# Patient Record
Sex: Female | Born: 1975
Health system: Southern US, Community
[De-identification: ages and names within clinical notes are randomized; demographics above are authoritative.]

## PROBLEM LIST (undated history)

## (undated) DIAGNOSIS — J3089 Other allergic rhinitis: Secondary | ICD-10-CM

## (undated) DIAGNOSIS — K219 Gastro-esophageal reflux disease without esophagitis: Secondary | ICD-10-CM

## (undated) DIAGNOSIS — T7840XA Allergy, unspecified, initial encounter: Secondary | ICD-10-CM

## (undated) HISTORY — DX: Other allergic rhinitis: J30.89

## (undated) HISTORY — DX: Allergy, unspecified, initial encounter: T78.40XA

## (undated) HISTORY — DX: Gastro-esophageal reflux disease without esophagitis: K21.9

---

## 2002-07-07 ENCOUNTER — Emergency Department (HOSPITAL_COMMUNITY): Admission: EM | Admit: 2002-07-07 | Discharge: 2002-07-07 | Payer: Self-pay | Admitting: Emergency Medicine

## 2002-08-02 ENCOUNTER — Emergency Department (HOSPITAL_COMMUNITY): Admission: EM | Admit: 2002-08-02 | Discharge: 2002-08-03 | Payer: Self-pay | Admitting: *Deleted

## 2003-07-14 ENCOUNTER — Other Ambulatory Visit: Admission: RE | Admit: 2003-07-14 | Discharge: 2003-07-14 | Payer: Self-pay | Admitting: Obstetrics and Gynecology

## 2005-02-04 ENCOUNTER — Other Ambulatory Visit: Admission: RE | Admit: 2005-02-04 | Discharge: 2005-02-04 | Payer: Self-pay | Admitting: Obstetrics and Gynecology

## 2008-01-22 ENCOUNTER — Emergency Department: Payer: Self-pay | Admitting: Emergency Medicine

## 2008-02-03 ENCOUNTER — Emergency Department: Payer: Self-pay | Admitting: Internal Medicine

## 2010-11-15 ENCOUNTER — Ambulatory Visit: Payer: Self-pay | Admitting: Family Medicine

## 2010-12-06 ENCOUNTER — Other Ambulatory Visit (HOSPITAL_COMMUNITY)
Admission: RE | Admit: 2010-12-06 | Discharge: 2010-12-06 | Disposition: A | Discharge status: Home / Self Care | Payer: 59 | Source: Ambulatory Visit | Attending: Family Medicine | Admitting: Family Medicine

## 2010-12-06 ENCOUNTER — Ambulatory Visit (INDEPENDENT_AMBULATORY_CARE_PROVIDER_SITE_OTHER): Payer: 59 | Admitting: Family Medicine

## 2010-12-06 ENCOUNTER — Encounter: Payer: Self-pay | Admitting: Family Medicine

## 2010-12-06 VITALS — BP 130/90 | HR 91 | Temp 98.2°F | Ht 64.0 in | Wt 194.5 lb

## 2010-12-06 DIAGNOSIS — Z01419 Encounter for gynecological examination (general) (routine) without abnormal findings: Secondary | ICD-10-CM | POA: Insufficient documentation

## 2010-12-06 DIAGNOSIS — Z1159 Encounter for screening for other viral diseases: Secondary | ICD-10-CM | POA: Insufficient documentation

## 2010-12-06 DIAGNOSIS — Z0000 Encounter for general adult medical examination without abnormal findings: Secondary | ICD-10-CM | POA: Insufficient documentation

## 2010-12-06 DIAGNOSIS — Z113 Encounter for screening for infections with a predominantly sexual mode of transmission: Secondary | ICD-10-CM | POA: Insufficient documentation

## 2010-12-06 DIAGNOSIS — Z0001 Encounter for general adult medical examination with abnormal findings: Secondary | ICD-10-CM | POA: Insufficient documentation

## 2010-12-06 DIAGNOSIS — Z136 Encounter for screening for cardiovascular disorders: Secondary | ICD-10-CM

## 2010-12-06 NOTE — Progress Notes (Signed)
Subjective:    Patient ID: Tricia Davenport, female    DOB: 04/17/1976, 35 y.o.   MRN: [id number]  HPI  35 yo here to establish care and for CPX. No complaints. G0, no h/o abnormal pap smears. Denies any dysuria or vaginal discharge.  Review of Systems See HPI Patient reports no  vision/ hearing changes,anorexia, weight change, fever ,adenopathy, persistant / recurrent hoarseness, swallowing issues, chest pain, edema,persistant / recurrent cough, hemoptysis, dyspnea(rest, exertional, paroxysmal nocturnal), gastrointestinal  bleeding (melena, rectal bleeding), abdominal pain, excessive heart burn, GU symptoms(dysuria, hematuria, pyuria, voiding/incontinence  Issues) syncope, focal weakness, severe memory loss, concerning skin lesions, depression, anxiety, abnormal bruising/bleeding, major joint swelling, breast masses or abnormal vaginal bleeding.       No past medical history on file.  No past surgical history on file.  History   Social History  . Marital Status: Single    Spouse Name: N/A    Number of Children: N/A  . Years of Education: N/A   Occupational History  . Not on file.   Social History Main Topics  . Smoking status: Never Smoker   . Smokeless tobacco: Not on file  . Alcohol Use: Yes  . Drug Use: Not on file  . Sexually Active: Not on file   Other Topics Concern  . Not on file   Social History Narrative   Works for [employer].Single, no children.Tricia Davenport's sister in law.    Family History  Problem Relation Age of Onset  . Diabetes Mother   . Hypertension Mother   . Hypertension Father     No Known Allergies  No current outpatient prescriptions on file prior to visit.      Objective:   Physical Exam  BP 130/90  Pulse 91  Temp(Src) 98.2 F (36.8 C) (Oral)  Ht 5\' 4"  (1.626 m)  Wt 194 lb 8 oz (88.225 kg)  BMI 33.39 kg/m2  LMP 10/23/2010  General:  Well-developed,well-nourished,in no acute distress; alert,appropriate and cooperative  throughout examination Head:  normocephalic and atraumatic.   Eyes:  vision grossly intact, pupils equal, pupils round, and pupils reactive to light.   Ears:  R ear normal and L ear normal.   Nose:  no external deformity.   Mouth:  good dentition.   Neck:  No deformities, masses, or tenderness noted. Breasts:  No mass, nodules, thickening, tenderness, bulging, retraction, inflamation, nipple discharge or skin changes noted.   Lungs:  Normal respiratory effort, chest expands symmetrically. Lungs are clear to auscultation, no crackles or wheezes. Heart:  Normal rate and regular rhythm. S1 and S2 normal without gallop, murmur, click, rub or other extra sounds. Abdomen:  Bowel sounds positive,abdomen soft and non-tender without masses, organomegaly or hernias noted. Rectal:  no external abnormalities.   Genitalia:  Pelvic Exam:        External: normal female genitalia without lesions or masses        Vagina: normal without lesions or masses        Cervix: normal without lesions or masses        Adnexa: normal bimanual exam without masses or fullness        Uterus: normal by palpation        Pap smear: performed Msk:  No deformity or scoliosis noted of thoracic or lumbar spine.   Extremities:  No clubbing, cyanosis, edema, or deformity noted with normal full range of motion of all joints.   Neurologic:  alert & oriented X3 and gait normal.  Skin:  Intact without suspicious lesions or rashes Cervical Nodes:  No lymphadenopathy noted Axillary Nodes:  No palpable lymphadenopathy Psych:  Cognition and judgment appear intact. Alert and cooperative with normal attention span and concentration. No apparent delusions, illusions, hallucinations       Assessment & Plan:   1. Routine general medical examination at a health care facility    Reviewed preventive care protocols, scheduled due services, and updated immunizations Discussed nutrition, exercise, diet, and healthy lifestyle. Orders  Placed This Encounter  Procedures  . Basic Metabolic Panel (BMET)  . Direct LDL

## 2010-12-06 NOTE — Patient Instructions (Signed)
It was so nice to meet you. We will be in touch with your labs. I will definitely look at your web site and be in touch soon!

## 2010-12-07 LAB — BASIC METABOLIC PANEL
Potassium: 3.7 mEq/L (ref 3.5–5.3)
Sodium: 136 mEq/L (ref 135–145)

## 2010-12-07 LAB — LDL CHOLESTEROL, DIRECT: Direct LDL: 96 mg/dL

## 2010-12-12 ENCOUNTER — Encounter: Payer: Self-pay | Admitting: *Deleted

## 2013-03-03 ENCOUNTER — Ambulatory Visit (INDEPENDENT_AMBULATORY_CARE_PROVIDER_SITE_OTHER): Payer: 59 | Admitting: Family Medicine

## 2013-03-03 ENCOUNTER — Encounter: Payer: 59 | Admitting: Family Medicine

## 2013-03-03 ENCOUNTER — Encounter: Payer: Self-pay | Admitting: Family Medicine

## 2013-03-03 ENCOUNTER — Other Ambulatory Visit (HOSPITAL_COMMUNITY)
Admission: RE | Admit: 2013-03-03 | Discharge: 2013-03-03 | Disposition: A | Discharge status: Home / Self Care | Payer: 59 | Source: Ambulatory Visit | Attending: Family Medicine | Admitting: Family Medicine

## 2013-03-03 ENCOUNTER — Encounter: Payer: Self-pay | Admitting: *Deleted

## 2013-03-03 VITALS — BP 108/80 | HR 71 | Temp 98.2°F | Ht 64.25 in | Wt 197.8 lb

## 2013-03-03 DIAGNOSIS — Z0000 Encounter for general adult medical examination without abnormal findings: Secondary | ICD-10-CM

## 2013-03-03 DIAGNOSIS — Z124 Encounter for screening for malignant neoplasm of cervix: Secondary | ICD-10-CM

## 2013-03-03 DIAGNOSIS — Z13220 Encounter for screening for lipoid disorders: Secondary | ICD-10-CM

## 2013-03-03 DIAGNOSIS — Z01419 Encounter for gynecological examination (general) (routine) without abnormal findings: Secondary | ICD-10-CM | POA: Insufficient documentation

## 2013-03-03 LAB — COMPREHENSIVE METABOLIC PANEL
ALT: 24 U/L (ref 0–35)
AST: 25 U/L (ref 0–37)
CO2: 26 mEq/L (ref 19–32)
Calcium: 9.2 mg/dL (ref 8.4–10.5)
Chloride: 102 mEq/L (ref 96–112)
GFR: 126.75 mL/min (ref >60.00)
Potassium: 3.8 mEq/L (ref 3.5–5.1)
Sodium: 136 mEq/L (ref 135–145)
Total Protein: 8 g/dL (ref 6.0–8.3)

## 2013-03-03 LAB — LIPID PANEL
Total CHOL/HDL Ratio: 4
VLDL: 14.2 mg/dL (ref 0.0–40.0)

## 2013-03-03 NOTE — Patient Instructions (Signed)
We will call with lab results. Continue working on healthy eating, exercise and weight loss.

## 2013-03-03 NOTE — Addendum Note (Signed)
Addended by: Damita Lack on: 03/03/2013 10:47 AM   Modules accepted: Orders

## 2013-03-03 NOTE — Progress Notes (Signed)
Subjective:    Patient ID: Tricia Davenport, female    DOB: Jan 17, 1976, 37 y.o.   MRN: [id number]  HPI  The patient is here for annual wellness exam and preventative care.   Pt of Dr. Elmer Sow  No past abn paps.  No concern for STDs.  Running 2 days a week. Diet: healthy, fruit and veggies, water, gets adequate calcium intake. History   Social History  . Marital Status: Single    Spouse Name: N/A    Number of Children: N/A  . Years of Education: N/A   Social History Main Topics  . Smoking status: Never Smoker   . Smokeless tobacco: Never Used  . Alcohol Use: 0.6 oz/week    1 Glasses of wine per week     Comment: social  . Drug Use: No  . Sexual Activity: Yes    Birth Control/ Protection: Condom   Other Topics Concern  . None   Social History Narrative   Works for [employer].   Single, no children.   [name]'s sister in law.        Review of Systems  Constitutional: Negative for fever, fatigue and unexpected weight change.  HENT: Negative for congestion, ear pain, sinus pressure, sneezing, sore throat and trouble swallowing.   Eyes: Negative for pain and itching.  Respiratory: Negative for cough, chest tightness, shortness of breath and wheezing.   Cardiovascular: Negative for chest pain, palpitations and leg swelling.  Gastrointestinal: Negative for nausea, abdominal pain, diarrhea, constipation and blood in stool.  Genitourinary: Negative for dysuria, hematuria, vaginal discharge, difficulty urinating and menstrual problem.  Skin: Negative for rash.  Neurological: Negative for syncope, weakness, light-headedness, numbness and headaches.  Psychiatric/Behavioral: Negative for confusion and dysphoric mood. The patient is not nervous/anxious.        Objective:   Physical Exam  Constitutional: Vital signs are normal. She appears well-developed and well-nourished. She is cooperative.  Non-toxic appearance. She does not appear ill. No distress.  HENT:   Head: Normocephalic.  Right Ear: Hearing, tympanic membrane, external ear and ear canal normal.  Left Ear: Hearing, tympanic membrane, external ear and ear canal normal.  Nose: Nose normal.  Eyes: Conjunctivae, EOM and lids are normal. Pupils are equal, round, and reactive to light. Lids are everted and swept, no foreign bodies found.  Neck: Trachea normal and normal range of motion. Neck supple. Carotid bruit is not present. No mass and no thyromegaly present.  Cardiovascular: Normal rate, regular rhythm, S1 normal, S2 normal, normal heart sounds and intact distal pulses.  Exam reveals no gallop.   No murmur heard. Pulmonary/Chest: Effort normal and breath sounds normal. No respiratory distress. She has no wheezes. She has no rhonchi. She has no rales.  Abdominal: Soft. Normal appearance and bowel sounds are normal. She exhibits no distension, no fluid wave, no abdominal bruit and no mass. There is no hepatosplenomegaly. There is no tenderness. There is no rebound, no guarding and no CVA tenderness. No hernia.  Genitourinary: Vagina normal and uterus normal. No breast swelling, tenderness, discharge or bleeding. Pelvic exam was performed with patient prone. There is no rash, tenderness or lesion on the right labia. There is no rash, tenderness or lesion on the left labia. Uterus is not enlarged and not tender. Cervix exhibits no motion tenderness, no discharge and no friability. Right adnexum displays no mass, no tenderness and no fullness. Left adnexum displays no mass, no tenderness and no fullness.  Lymphadenopathy:    She has no  cervical adenopathy.    She has no axillary adenopathy.  Neurological: She is alert. She has normal strength. No cranial nerve deficit or sensory deficit.  Skin: Skin is warm, dry and intact. No rash noted.  Psychiatric: Her speech is normal and behavior is normal. Judgment normal. Her mood appears not anxious. Cognition and memory are normal. She does not exhibit a  depressed mood.          Assessment & Plan:  The patient's preventative maintenance and recommended screening tests for an annual wellness exam were reviewed in full today. Brought up to date unless services declined.  Counselled on the importance of diet, exercise, and its role in overall health and mortality. The patient's FH and SH was reviewed, including their home life, tobacco status, and drug and alcohol status.   2012 nml pap.. On q2  year schedule, if nml then space every 3 year. No family hx of breast cancer. No desire for STD screen.

## 2013-03-07 ENCOUNTER — Encounter: Payer: Self-pay | Admitting: *Deleted

## 2013-03-28 ENCOUNTER — Telehealth: Payer: Self-pay | Admitting: Family Medicine

## 2013-03-28 DIAGNOSIS — Z0279 Encounter for issue of other medical certificate: Secondary | ICD-10-CM

## 2013-03-28 NOTE — Telephone Encounter (Signed)
In outbox

## 2013-03-28 NOTE — Telephone Encounter (Signed)
Wellness form completed and placed in Dr. Daphine Deutscher in box for signature.

## 2013-03-28 NOTE — Telephone Encounter (Signed)
Pt dropped off Provider Notification forms to be completed. I have placed these on your desk for Dr. Ermalene Searing. Dr. Dayton Martes is her PCP but it looks like Dr. Ermalene Searing completed pt's CPE for this year. Thank you.

## 2014-04-03 ENCOUNTER — Encounter: Payer: 59 | Admitting: Family Medicine

## 2014-04-12 ENCOUNTER — Encounter: Payer: Self-pay | Admitting: Family Medicine

## 2014-04-12 ENCOUNTER — Other Ambulatory Visit (HOSPITAL_COMMUNITY)
Admission: RE | Admit: 2014-04-12 | Discharge: 2014-04-12 | Disposition: A | Discharge status: Home / Self Care | Payer: 59 | Source: Ambulatory Visit | Attending: Family Medicine | Admitting: Family Medicine

## 2014-04-12 ENCOUNTER — Ambulatory Visit (INDEPENDENT_AMBULATORY_CARE_PROVIDER_SITE_OTHER): Payer: 59 | Admitting: Family Medicine

## 2014-04-12 VITALS — BP 122/84 | HR 84 | Temp 98.2°F | Ht 64.5 in | Wt 205.5 lb

## 2014-04-12 DIAGNOSIS — Z01419 Encounter for gynecological examination (general) (routine) without abnormal findings: Secondary | ICD-10-CM

## 2014-04-12 DIAGNOSIS — Z1151 Encounter for screening for human papillomavirus (HPV): Secondary | ICD-10-CM | POA: Diagnosis present

## 2014-04-12 LAB — COMPREHENSIVE METABOLIC PANEL
ALT: 22 U/L (ref 0–35)
AST: 23 U/L (ref 0–37)
Albumin: 4.1 g/dL (ref 3.5–5.2)
Alkaline Phosphatase: 63 U/L (ref 39–117)
BILIRUBIN TOTAL: 0.3 mg/dL (ref 0.2–1.2)
BUN: 11 mg/dL (ref 6–23)
CHLORIDE: 104 meq/L (ref 96–112)
CO2: 23 mEq/L (ref 19–32)
CREATININE: 0.7 mg/dL (ref 0.4–1.2)
Calcium: 9 mg/dL (ref 8.4–10.5)
GFR: 132.68 mL/min (ref >60.00)
Glucose, Bld: 102 mg/dL — ABNORMAL HIGH (ref 70–99)
Potassium: 3.8 mEq/L (ref 3.5–5.1)
SODIUM: 136 meq/L (ref 135–145)
Total Protein: 7.7 g/dL (ref 6.0–8.3)

## 2014-04-12 LAB — CBC WITH DIFFERENTIAL/PLATELET
BASOS ABS: 0 10*3/uL (ref 0.0–0.1)
Basophils Relative: 0.4 % (ref 0.0–3.0)
EOS ABS: 0.3 10*3/uL (ref 0.0–0.7)
Eosinophils Relative: 3.7 % (ref 0.0–5.0)
HCT: 41 % (ref 36.0–46.0)
HEMOGLOBIN: 13.3 g/dL (ref 12.0–15.0)
LYMPHS ABS: 3 10*3/uL (ref 0.7–4.0)
LYMPHS PCT: 37.6 % (ref 12.0–46.0)
MCHC: 32.4 g/dL (ref 30.0–36.0)
MCV: 93.6 fl (ref 78.0–100.0)
Monocytes Absolute: 0.7 10*3/uL (ref 0.1–1.0)
Monocytes Relative: 9.1 % (ref 3.0–12.0)
NEUTROS ABS: 4 10*3/uL (ref 1.4–7.7)
Neutrophils Relative %: 49.2 % (ref 43.0–77.0)
PLATELETS: 347 10*3/uL (ref 150.0–400.0)
RBC: 4.38 Mil/uL (ref 3.87–5.11)
RDW: 13.1 % (ref 11.5–15.5)
WBC: 8 10*3/uL (ref 4.0–10.5)

## 2014-04-12 LAB — LIPID PANEL
CHOL/HDL RATIO: 6
Cholesterol: 157 mg/dL (ref 0–200)
HDL: 26.8 mg/dL — AB (ref >39.00)
LDL Cholesterol: 113 mg/dL — ABNORMAL HIGH (ref 0–99)
NONHDL: 130.2
Triglycerides: 86 mg/dL (ref 0.0–149.0)
VLDL: 17.2 mg/dL (ref 0.0–40.0)

## 2014-04-12 LAB — TSH: TSH: 2.13 u[IU]/mL (ref 0.35–4.50)

## 2014-04-12 NOTE — Progress Notes (Signed)
Pre visit review using our clinic review tool, if applicable. No additional management support is needed unless otherwise documented below in the visit note. 

## 2014-04-12 NOTE — Addendum Note (Signed)
Addended by: Desmond DikeKNIGHT, Vanity Larsson H on: 04/12/2014 08:50 AM   Modules accepted: Orders

## 2014-04-12 NOTE — Progress Notes (Signed)
Subjective:    Patient ID: Tricia Davenport, female    DOB: [birth year], 38 y.o.   MRN: [id number]  HPI  [age] yo pleasant female here for CPX. No complaints. G0, no h/o abnormal pap smears. Denies any dysuria or vaginal discharge.  Last pap smear done by Dr. Ermalene SearingBedsole on 03/07/13.  No current outpatient prescriptions on file prior to visit.   No current facility-administered medications on file prior to visit.    No Known Allergies  No past medical history on file.  No past surgical history on file.  Family History  Problem Relation Age of Onset  . Diabetes Mother   . Hypertension Mother   . Hypertension Father     History   Social History  . Marital Status: Single    Spouse Name: N/A    Number of Children: N/A  . Years of Education: N/A   Occupational History  . Not on file.   Social History Main Topics  . Smoking status: Never Smoker   . Smokeless tobacco: Never Used  . Alcohol Use: 0.6 oz/week    1 Glasses of wine per week     Comment: social  . Drug Use: No  . Sexual Activity: Yes    Birth Control/ Protection: Condom   Other Topics Concern  . Not on file   Social History Narrative   Works for [employer].   Single, no children.   [name]'s sister in law.   The PMH, PSH, Social History, Family History, Medications, and allergies [facility], and have been updated if relevant.   Review of Systems See HPI Patient reports no  vision/ hearing changes,anorexia, weight change, fever ,adenopathy, persistant / recurrent hoarseness, swallowing issues, chest pain, edema,persistant / recurrent cough, hemoptysis, dyspnea(rest, exertional, paroxysmal nocturnal), gastrointestinal  bleeding (melena, rectal bleeding), abdominal pain, excessive heart burn, GU symptoms(dysuria, hematuria, pyuria, voiding/incontinence  Issues) syncope, focal weakness, severe memory loss, concerning skin lesions, depression, anxiety, abnormal bruising/bleeding, major  joint swelling, breast masses or abnormal vaginal bleeding.       No past medical history on file.  No past surgical history on file.  History   Social History  . Marital Status: Single    Spouse Name: N/A    Number of Children: N/A  . Years of Education: N/A   Occupational History  . Not on file.   Social History Main Topics  . Smoking status: Never Smoker   . Smokeless tobacco: Never Used  . Alcohol Use: 0.6 oz/week    1 Glasses of wine per week     Comment: social  . Drug Use: No  . Sexual Activity: Yes    Birth Control/ Protection: Condom   Other Topics Concern  . Not on file   Social History Narrative   Works for [employer].   Single, no children.   [name]'s sister in law.    Family History  Problem Relation Age of Onset  . Diabetes Mother   . Hypertension Mother   . Hypertension Father     No Known Allergies  No current outpatient prescriptions on file prior to visit.   No current facility-administered medications on file prior to visit.      Objective:   Physical Exam  BP 122/84 mmHg  Pulse 84  Temp(Src) 98.2 F (36.8 C) (Oral)  Ht 5' 4.5" (1.638 m)  Wt 205 lb 8 oz (93.214 kg)  BMI 34.74 kg/m2  SpO2 98%  LMP 03/19/2014  General:  Well-developed,well-nourished,in no acute distress; alert,appropriate and cooperative throughout examination Head:  normocephalic and atraumatic.   Eyes:  vision grossly intact, pupils equal, pupils round, and pupils reactive to light.   Ears:  R ear normal and L ear normal.   Nose:  no external deformity.   Mouth:  good dentition.   Neck:  No deformities, masses, or tenderness noted. Breasts:  No mass, nodules, thickening, tenderness, bulging, retraction, inflamation, nipple discharge or skin changes noted.   Lungs:  Normal respiratory effort, chest expands symmetrically. Lungs are clear to auscultation, no crackles or wheezes. Heart:  Normal rate and regular rhythm. S1 and S2 normal without  gallop, murmur, click, rub or other extra sounds. Abdomen:  Bowel sounds positive,abdomen soft and non-tender without masses, organomegaly or hernias noted. Rectal:  no external abnormalities.   Genitalia:  Pelvic Exam:        External: normal female genitalia without lesions or masses        Vagina: normal without lesions or masses        Cervix: normal without lesions or masses        Adnexa: normal bimanual exam without masses or fullness        Uterus: normal by palpation        Pap smear: performed Msk:  No deformity or scoliosis noted of thoracic or lumbar spine.   Extremities:  No clubbing, cyanosis, edema, or deformity noted with normal full range of motion of all joints.   Neurologic:  alert & oriented X3 and gait normal.   Skin:  Intact without suspicious lesions or rashes Cervical Nodes:  No lymphadenopathy noted Axillary Nodes:  No palpable lymphadenopathy Psych:  Cognition and judgment appear intact. Alert and cooperative with normal attention span and concentration. No apparent delusions, illusions, hallucinations       Assessment & Plan:

## 2014-04-12 NOTE — Patient Instructions (Signed)
Great to see you. Happy Holidays!  We will call you with your lab results and you can view them online. 

## 2014-04-12 NOTE — Assessment & Plan Note (Signed)
.  Reviewed preventive care protocols, scheduled due services, and updated immunizations Discussed nutrition, exercise, diet, and healthy lifestyle.  Discussed USPSTF recommendations of cervical cancer screening.  She is aware that interval of 3 years is recommended but pt would prefer to have pap smear done today.  Orders Placed This Encounter  Procedures  . CBC with Differential  . Comprehensive metabolic panel  . Lipid panel  . TSH

## 2014-04-13 ENCOUNTER — Encounter: Payer: Self-pay | Admitting: *Deleted

## 2014-04-13 LAB — CYTOLOGY - PAP

## 2014-04-14 ENCOUNTER — Encounter: Payer: Self-pay | Admitting: *Deleted

## 2014-05-09 ENCOUNTER — Encounter: Payer: Self-pay | Admitting: Family Medicine

## 2014-05-09 ENCOUNTER — Ambulatory Visit (INDEPENDENT_AMBULATORY_CARE_PROVIDER_SITE_OTHER): Payer: 59 | Admitting: Family Medicine

## 2014-05-09 VITALS — BP 128/70 | HR 85 | Temp 98.0°F | Wt 207.8 lb

## 2014-05-09 DIAGNOSIS — J040 Acute laryngitis: Secondary | ICD-10-CM

## 2014-05-09 DIAGNOSIS — J3489 Other specified disorders of nose and nasal sinuses: Secondary | ICD-10-CM

## 2014-05-09 MED ORDER — IPRATROPIUM BROMIDE 0.06 % NA SOLN: 2.0000 | Freq: Four times a day (QID) | NASAL | Status: DC

## 2014-05-09 NOTE — Progress Notes (Signed)
   Dr. Karleen HampshireSpencer T. Cheney Ewart, MD, CAQ Sports Medicine [facility] [address] [place], [id number] Phone: [phone] Fax: [phone]  05/09/2014  Patient: Tricia MeuseMarcia Cobb, MRN: [id number], DOB: 1976/01/29, 38 y.o.  Primary Physician:  Ruthe Mannanalia Aron, MD  Chief Complaint: Nasal Congestion  Subjective:   Tricia MeuseMarcia Cobb is a 38 y.o. very pleasant female patient who presents with the following:  A lot of drainage and sounded abnormal. Over the past week the patient has had a lot of nasal drainage and her voice is been altered fairly significantly. She is not having any significant cough, no significant sore throat, no earache. She is eating and drinking normally.  Past Medical History, Surgical History, Social History, Family History, Problem List, Medications, and Allergies have been reviewed and updated if relevant.  ROS: GEN: Acute illness details above GI: Tolerating PO intake GU: maintaining adequate hydration and urination Pulm: No SOB Interactive and getting along well at home.  Otherwise, ROS is as per the HPI.   Objective:   BP 128/70 mmHg  Pulse 85  Temp(Src) 98 F (36.7 C) (Oral)  Wt 207 lb 12 oz (94.235 kg)  SpO2 96%  LMP 03/29/2014   Gen: WDWN, NAD; A & O x3, cooperative. Pleasant.Globally Non-toxic HEENT: Normocephalic and atraumatic. Throat clear, w/o exudate, R TM clear, L TM - good landmarks, No fluid present. rhinnorhea.  MMM Frontal sinuses: NT Max sinuses: NT NECK: Anterior cervical  LAD is absent CV: RRR, No M/G/R, cap refill <2 sec PULM: Breathing comfortably in no respiratory distress. no wheezing, crackles, rhonchi EXT: No c/c/e PSYCH: Friendly, good eye contact MSK: Nml gait     Laboratory and Imaging Data:  Assessment and Plan:   Laryngitis  Nasal drainage  Reassurance, supportive care, and suggested that she takes a Mucinex D.  Follow-up: No Follow-up on file.  New Prescriptions   IPRATROPIUM (ATROVENT) 0.06 %  NASAL SPRAY    Place 2 sprays into both nostrils 4 (four) times daily.   No orders of the defined types were placed in this encounter.    Signed,  Elpidio GaleaSpencer T. Rashun Grattan, MD   Patient's Medications  New Prescriptions   IPRATROPIUM (ATROVENT) 0.06 % NASAL SPRAY    Place 2 sprays into both nostrils 4 (four) times daily.  Previous Medications   No medications on file  Modified Medications   No medications on file  Discontinued Medications   No medications on file     ,[

## 2014-05-09 NOTE — Progress Notes (Signed)
Pre visit review using our clinic review tool, if applicable. No additional management support is needed unless otherwise documented below in the visit note. 

## 2014-12-22 ENCOUNTER — Encounter: Payer: Self-pay | Admitting: Primary Care

## 2014-12-22 ENCOUNTER — Ambulatory Visit (INDEPENDENT_AMBULATORY_CARE_PROVIDER_SITE_OTHER): Payer: 59 | Admitting: Primary Care

## 2014-12-22 VITALS — BP 124/70 | HR 80 | Temp 97.9°F | Ht 64.5 in | Wt 203.1 lb

## 2014-12-22 DIAGNOSIS — J029 Acute pharyngitis, unspecified: Secondary | ICD-10-CM | POA: Diagnosis not present

## 2014-12-22 NOTE — Progress Notes (Signed)
   Subjective:    Patient ID: Tricia Davenport, female    DOB: 1975-06-20, 39 y.o.   MRN: [id number]  HPI  Tricia Davenport is a 39 year old female who presents today with a chief complaint of sore throat. Her sore throat has been present for the past 2-3 days. She also reports nasal congestion and sinus pressure/headache. Overall her symptoms have improved, Wednesday she felt worse with body aches. Denies fevers, reports chills.    Review of Systems  Constitutional: Positive for chills. Negative for fever.  HENT: Positive for congestion, sinus pressure and sore throat. Negative for ear pain.   Respiratory: Negative for cough and shortness of breath.   Cardiovascular: Negative for chest pain.  Musculoskeletal: Positive for myalgias.       No past medical history on file.  Social History   Social History  . Marital Status: Single    Spouse Name: N/A  . Number of Children: N/A  . Years of Education: N/A   Occupational History  . Not on file.   Social History Main Topics  . Smoking status: Never Smoker   . Smokeless tobacco: Never Used  . Alcohol Use: 0.6 oz/week    1 Glasses of wine per week     Comment: social  . Drug Use: No  . Sexual Activity: Yes    Birth Control/ Protection: Condom   Other Topics Concern  . Not on file   Social History Narrative   Works for [employer].   Single, no children.   Tricia Davenport's sister in law.    No past surgical history on file.  Family History  Problem Relation Age of Onset  . Diabetes Mother   . Hypertension Mother   . Hypertension Father     No Known Allergies  No current outpatient prescriptions on file prior to visit.   No current facility-administered medications on file prior to visit.    BP 124/70 mmHg  Pulse 80  Temp(Src) 97.9 F (36.6 C) (Oral)  Ht 5' 4.5" (1.638 m)  Wt 203 lb 1.9 oz (92.135 kg)  BMI 34.34 kg/m2  SpO2 98%  LMP 12/11/2014    Objective:   Physical Exam  Constitutional: She appears  well-nourished.  HENT:  Right Ear: Tympanic membrane and ear canal normal.  Left Ear: Tympanic membrane and ear canal normal.  Nose: Right sinus exhibits no maxillary sinus tenderness and no frontal sinus tenderness. Left sinus exhibits no maxillary sinus tenderness and no frontal sinus tenderness.  Mouth/Throat: Posterior oropharyngeal erythema present. No oropharyngeal exudate or posterior oropharyngeal edema.  Cardiovascular: Normal rate and regular rhythm.   Pulmonary/Chest: Effort normal and breath sounds normal.  Skin: Skin is warm and dry.          Assessment & Plan:  Sore throat:  Present since Wednesday with nasal congestion and sinus pressure. Improvement overall with OTC meds. She would like rapid strep test today. Rapid strep: Negative. Push fluids, ibuprofen PRN, warm salt gargles. Follow up if no improvement by next Wednesday.

## 2014-12-22 NOTE — Patient Instructions (Signed)
Your strep test is negative.  Take ibuprofen 600 to 800 mg three times daily as needed for pain and inflammation to throat.  Gargle warm salt water three times daily for pain.  Call me if no improvement by next Wednesday.  It was a pleasure meeting you!

## 2014-12-22 NOTE — Progress Notes (Signed)
Pre visit review using our clinic review tool, if applicable. No additional management support is needed unless otherwise documented below in the visit note. 

## 2015-01-11 ENCOUNTER — Ambulatory Visit (INDEPENDENT_AMBULATORY_CARE_PROVIDER_SITE_OTHER): Payer: 59 | Admitting: Family Medicine

## 2015-01-11 ENCOUNTER — Other Ambulatory Visit (HOSPITAL_COMMUNITY)
Admission: RE | Admit: 2015-01-11 | Discharge: 2015-01-11 | Disposition: A | Discharge status: Home / Self Care | Payer: 59 | Source: Ambulatory Visit | Attending: Family Medicine | Admitting: Family Medicine

## 2015-01-11 ENCOUNTER — Encounter: Payer: Self-pay | Admitting: Family Medicine

## 2015-01-11 VITALS — BP 132/80 | HR 70 | Temp 97.9°F | Ht 64.0 in | Wt 204.5 lb

## 2015-01-11 DIAGNOSIS — Z1151 Encounter for screening for human papillomavirus (HPV): Secondary | ICD-10-CM | POA: Insufficient documentation

## 2015-01-11 DIAGNOSIS — Z01419 Encounter for gynecological examination (general) (routine) without abnormal findings: Secondary | ICD-10-CM | POA: Insufficient documentation

## 2015-01-11 LAB — LIPID PANEL
CHOL/HDL RATIO: 5
Cholesterol: 159 mg/dL (ref 0–200)
HDL: 33.8 mg/dL — ABNORMAL LOW (ref >39.00)
LDL CALC: 114 mg/dL — AB (ref 0–99)
NonHDL: 125.49
TRIGLYCERIDES: 58 mg/dL (ref 0.0–149.0)
VLDL: 11.6 mg/dL (ref 0.0–40.0)

## 2015-01-11 LAB — CBC WITH DIFFERENTIAL/PLATELET
Basophils Absolute: 0 10*3/uL (ref 0.0–0.1)
Basophils Relative: 0.4 % (ref 0.0–3.0)
EOS ABS: 0.2 10*3/uL (ref 0.0–0.7)
Eosinophils Relative: 4.4 % (ref 0.0–5.0)
HCT: 38.7 % (ref 36.0–46.0)
Hemoglobin: 12.9 g/dL (ref 12.0–15.0)
Lymphocytes Relative: 42.9 % (ref 12.0–46.0)
Lymphs Abs: 2.4 10*3/uL (ref 0.7–4.0)
MCHC: 33.3 g/dL (ref 30.0–36.0)
MCV: 92.3 fl (ref 78.0–100.0)
MONO ABS: 0.7 10*3/uL (ref 0.1–1.0)
Monocytes Relative: 11.6 % (ref 3.0–12.0)
Neutro Abs: 2.3 10*3/uL (ref 1.4–7.7)
Neutrophils Relative %: 40.7 % — ABNORMAL LOW (ref 43.0–77.0)
PLATELETS: 353 10*3/uL (ref 150.0–400.0)
RBC: 4.19 Mil/uL (ref 3.87–5.11)
RDW: 14 % (ref 11.5–15.5)
WBC: 5.6 10*3/uL (ref 4.0–10.5)

## 2015-01-11 LAB — COMPREHENSIVE METABOLIC PANEL
ALT: 43 U/L — ABNORMAL HIGH (ref 0–35)
AST: 39 U/L — AB (ref 0–37)
Albumin: 4.4 g/dL (ref 3.5–5.2)
Alkaline Phosphatase: 66 U/L (ref 39–117)
BUN: 10 mg/dL (ref 6–23)
CALCIUM: 9.4 mg/dL (ref 8.4–10.5)
CHLORIDE: 103 meq/L (ref 96–112)
CO2: 28 mEq/L (ref 19–32)
CREATININE: 0.61 mg/dL (ref 0.40–1.20)
GFR: 142.17 mL/min (ref >60.00)
Glucose, Bld: 115 mg/dL — ABNORMAL HIGH (ref 70–99)
POTASSIUM: 3.5 meq/L (ref 3.5–5.1)
Sodium: 138 mEq/L (ref 135–145)
Total Bilirubin: 0.5 mg/dL (ref 0.2–1.2)
Total Protein: 7.8 g/dL (ref 6.0–8.3)

## 2015-01-11 LAB — TSH: TSH: 1.28 u[IU]/mL (ref 0.35–4.50)

## 2015-01-11 NOTE — Progress Notes (Addendum)
Subjective:    Patient ID: Tricia Davenport, female    DOB: 1975/05/13, 39 y.o.   MRN: [id number]  HPI  39 yo pleasant female here for CPX. No complaints. G0, no h/o abnormal pap smears. Denies any dysuria or vaginal discharge.  Last pap smear done by me on 04/12/14  Lab Results  Component Value Date   CHOL 157 04/12/2014   HDL 26.80* 04/12/2014   LDLCALC 113* 04/12/2014   LDLDIRECT 96 12/06/2010   TRIG 86.0 04/12/2014   CHOLHDL 6 04/12/2014   Lab Results  Component Value Date   CREATININE 0.7 04/12/2014   Lab Results  Component Value Date   TSH 2.13 04/12/2014   Lab Results  Component Value Date   NA 136 04/12/2014   K 3.8 04/12/2014   CL 104 04/12/2014   CO2 23 04/12/2014   Lab Results  Component Value Date   WBC 8.0 04/12/2014   HGB 13.3 04/12/2014   HCT 41.0 04/12/2014   MCV 93.6 04/12/2014   PLT 347.0 04/12/2014   Lab Results  Component Value Date   ALT 22 04/12/2014   AST 23 04/12/2014   ALKPHOS 63 04/12/2014   BILITOT 0.3 04/12/2014   .  No current outpatient prescriptions on file prior to visit.   No current facility-administered medications on file prior to visit.    No Known Allergies  History reviewed. No pertinent past medical history.  History reviewed. No pertinent past surgical history.  Family History  Problem Relation Age of Onset  . Diabetes Mother   . Hypertension Mother   . Hypertension Father     Social History   Social History  . Marital Status: Single    Spouse Name: N/A  . Number of Children: N/A  . Years of Education: N/A   Occupational History  . Not on file.   Social History Main Topics  . Smoking status: Never Smoker   . Smokeless tobacco: Never Used  . Alcohol Use: 0.6 oz/week    1 Glasses of wine per week     Comment: social  . Drug Use: No  . Sexual Activity: Yes    Birth Control/ Protection: Condom   Other Topics Concern  . Not on file   Social History Narrative   Works for [employer].    Single, no children.   [name]'s sister in law.   The PMH, PSH, Social History, Family History, Medications, and allergies have been reviewed in [facility], and have been updated if relevant.   Review of Systems  Constitutional: Negative.   HENT: Negative.   Eyes: Negative.   Respiratory: Negative.   Cardiovascular: Negative.   Gastrointestinal: Negative.   Endocrine: Negative.   Genitourinary: Negative.   Musculoskeletal: Negative.   Skin: Negative.   Allergic/Immunologic: Negative.   Neurological: Negative.   Hematological: Negative.   Psychiatric/Behavioral: Negative.   All other systems reviewed and are negative.      History reviewed. No pertinent past medical history.  History reviewed. No pertinent past surgical history.  Social History   Social History  . Marital Status: Single    Spouse Name: N/A  . Number of Children: N/A  . Years of Education: N/A   Occupational History  . Not on file.   Social History Main Topics  . Smoking status: Never Smoker   . Smokeless tobacco: Never Used  . Alcohol Use: 0.6 oz/week    1 Glasses of wine per week     Comment: social  .  Drug Use: No  . Sexual Activity: Yes    Birth Control/ Protection: Condom   Other Topics Concern  . Not on file   Social History Narrative   Works for [employer].   Single, no children.   [name]'s sister in law.    Family History  Problem Relation Age of Onset  . Diabetes Mother   . Hypertension Mother   . Hypertension Father     No Known Allergies  No current outpatient prescriptions on file prior to visit.   No current facility-administered medications on file prior to visit.      Objective:   Physical Exam  LMP 12/11/2014 BP 132/80 mmHg  Pulse 70  Temp(Src) 97.9 F (36.6 C) (Oral)  Ht 5\' 4"  (1.626 m)  Wt 204 lb 8 oz (92.761 kg)  BMI 35.09 kg/m2  SpO2 97%  LMP 12/11/2014   General:  Well-developed,well-nourished,in no acute distress;  alert,appropriate and cooperative throughout examination Head:  normocephalic and atraumatic.   Eyes:  vision grossly intact, pupils equal, pupils round, and pupils reactive to light.   Ears:  R ear normal and L ear normal.   Nose:  no external deformity.   Mouth:  good dentition.   Neck:  No deformities, masses, or tenderness noted. Breasts:  No mass, nodules, thickening, tenderness, bulging, retraction, inflamation, nipple discharge or skin changes noted.   Lungs:  Normal respiratory effort, chest expands symmetrically. Lungs are clear to auscultation, no crackles or wheezes. Heart:  Normal rate and regular rhythm. S1 and S2 normal without gallop, murmur, click, rub or other extra sounds. Abdomen:  Bowel sounds positive,abdomen soft and non-tender without masses, organomegaly or hernias noted. Rectal:  no external abnormalities.   Genitalia:  Pelvic Exam:        External: normal female genitalia without lesions or masses        Vagina: normal without lesions or masses        Cervix: normal without lesions or masses        Adnexa: normal bimanual exam without masses or fullness        Uterus: normal by palpation        Pap smear: performed Msk:  No deformity or scoliosis noted of thoracic or lumbar spine.   Extremities:  No clubbing, cyanosis, edema, or deformity noted with normal full range of motion of all joints.   Neurologic:  alert & oriented X3 and gait normal.   Skin:  Intact without suspicious lesions or rashes Cervical Nodes:  No lymphadenopathy noted Axillary Nodes:  No palpable lymphadenopathy Psych:  Cognition and judgment appear intact. Alert and cooperative with normal attention span and concentration. No apparent delusions, illusions, hallucinations       Assessment & Plan:

## 2015-01-11 NOTE — Progress Notes (Signed)
Pre visit review using our clinic review tool, if applicable. No additional management support is needed unless otherwise documented below in the visit note. 

## 2015-01-11 NOTE — Addendum Note (Signed)
Addended by: Desmond Dike on: 01/11/2015 09:28 AM   Modules accepted: Orders

## 2015-01-11 NOTE — Addendum Note (Signed)
Addended by: Dianne Dun on: 01/11/2015 09:21 AM   Modules accepted: Kipp Brood

## 2015-01-11 NOTE — Assessment & Plan Note (Signed)
Reviewed preventive care protocols, scheduled due services, and updated immunizations Discussed nutrition, exercise, diet, and healthy lifestyle.  Discussed USPSTF recommendations of cervical cancer screening.  She is aware that interval of 3 years is recommended but pt would prefer to have pap smear done today.  Orders Placed This Encounter  Procedures  . CBC with Differential/Platelet  . Comprehensive metabolic panel  . Lipid panel  . TSH    

## 2015-01-12 LAB — CYTOLOGY - PAP

## 2015-01-16 ENCOUNTER — Encounter: Payer: Self-pay | Admitting: *Deleted

## 2016-01-22 ENCOUNTER — Other Ambulatory Visit (HOSPITAL_COMMUNITY)
Admission: RE | Admit: 2016-01-22 | Discharge: 2016-01-22 | Disposition: A | Discharge status: Home / Self Care | Payer: 59 | Source: Ambulatory Visit | Attending: Family Medicine | Admitting: Family Medicine

## 2016-01-22 ENCOUNTER — Ambulatory Visit (INDEPENDENT_AMBULATORY_CARE_PROVIDER_SITE_OTHER): Payer: 59 | Admitting: Family Medicine

## 2016-01-22 ENCOUNTER — Encounter: Payer: Self-pay | Admitting: Family Medicine

## 2016-01-22 VITALS — BP 124/70 | HR 70 | Temp 97.9°F | Ht 64.0 in | Wt 196.5 lb

## 2016-01-22 DIAGNOSIS — Z1151 Encounter for screening for human papillomavirus (HPV): Secondary | ICD-10-CM | POA: Insufficient documentation

## 2016-01-22 DIAGNOSIS — Z01419 Encounter for gynecological examination (general) (routine) without abnormal findings: Secondary | ICD-10-CM | POA: Diagnosis not present

## 2016-01-22 LAB — CBC WITH DIFFERENTIAL/PLATELET
BASOS ABS: 0 10*3/uL (ref 0.0–0.1)
Basophils Relative: 0.5 % (ref 0.0–3.0)
Eosinophils Absolute: 0.2 10*3/uL (ref 0.0–0.7)
Eosinophils Relative: 2.8 % (ref 0.0–5.0)
HCT: 38.7 % (ref 36.0–46.0)
Hemoglobin: 13 g/dL (ref 12.0–15.0)
Lymphocytes Relative: 46 % (ref 12.0–46.0)
Lymphs Abs: 4 10*3/uL (ref 0.7–4.0)
MCHC: 33.6 g/dL (ref 30.0–36.0)
MCV: 91.8 fl (ref 78.0–100.0)
MONOS PCT: 8.2 % (ref 3.0–12.0)
Monocytes Absolute: 0.7 10*3/uL (ref 0.1–1.0)
Neutro Abs: 3.7 10*3/uL (ref 1.4–7.7)
Neutrophils Relative %: 42.5 % — ABNORMAL LOW (ref 43.0–77.0)
PLATELETS: 362 10*3/uL (ref 150.0–400.0)
RBC: 4.21 Mil/uL (ref 3.87–5.11)
RDW: 13.6 % (ref 11.5–15.5)
WBC: 8.8 10*3/uL (ref 4.0–10.5)

## 2016-01-22 LAB — LIPID PANEL
CHOL/HDL RATIO: 4
Cholesterol: 142 mg/dL (ref 0–200)
HDL: 33.5 mg/dL — ABNORMAL LOW (ref >39.00)
LDL Cholesterol: 95 mg/dL (ref 0–99)
NONHDL: 108.07
Triglycerides: 65 mg/dL (ref 0.0–149.0)
VLDL: 13 mg/dL (ref 0.0–40.0)

## 2016-01-22 LAB — COMPREHENSIVE METABOLIC PANEL
ALK PHOS: 51 U/L (ref 39–117)
ALT: 12 U/L (ref 0–35)
AST: 16 U/L (ref 0–37)
Albumin: 4.1 g/dL (ref 3.5–5.2)
BILIRUBIN TOTAL: 0.7 mg/dL (ref 0.2–1.2)
BUN: 10 mg/dL (ref 6–23)
CO2: 29 meq/L (ref 19–32)
CREATININE: 0.57 mg/dL (ref 0.40–1.20)
Calcium: 8.7 mg/dL (ref 8.4–10.5)
Chloride: 103 mEq/L (ref 96–112)
GFR: 152.89 mL/min (ref >60.00)
GLUCOSE: 92 mg/dL (ref 70–99)
Potassium: 4 mEq/L (ref 3.5–5.1)
SODIUM: 135 meq/L (ref 135–145)
Total Protein: 7.3 g/dL (ref 6.0–8.3)

## 2016-01-22 LAB — TSH: TSH: 1.73 u[IU]/mL (ref 0.35–4.50)

## 2016-01-22 LAB — HEMOGLOBIN A1C: HEMOGLOBIN A1C: 5.8 % (ref 4.6–6.5)

## 2016-01-22 NOTE — Patient Instructions (Signed)
Great to see you.  We will call you with your results and you can view them online. 

## 2016-01-22 NOTE — Addendum Note (Signed)
Addended by: Desmond DikeKNIGHT, Brinton Brandel H on: 01/22/2016 09:36 AM   Modules accepted: Orders

## 2016-01-22 NOTE — Progress Notes (Signed)
Pre visit review using our clinic review tool, if applicable. No additional management support is needed unless otherwise documented below in the visit note. 

## 2016-01-22 NOTE — Assessment & Plan Note (Signed)
Reviewed preventive care protocols, scheduled due services, and updated immunizations Discussed nutrition, exercise, diet, and healthy lifestyle.  Orders Placed This Encounter  Procedures  . CBC with Differential/Platelet  . Comprehensive metabolic panel  . Lipid panel  . TSH  . Hemoglobin A1c   Discussed USPSTF recommendations of cervical cancer screening.  She is aware that interval of 3 years is recommended but pt would prefer to have pap smear done today.

## 2016-01-22 NOTE — Progress Notes (Signed)
Subjective:    Patient ID: Tricia Davenport, female    DOB: February 07, 1976, 40 y.o.   MRN: [id number]  HPI  40 yo pleasant female here for CPX. No complaints. G0, no h/o abnormal pap smears. Denies any dysuria or vaginal discharge.  Last pap smear done by me on 01/11/15  Lab Results  Component Value Date   CHOL 159 01/11/2015   HDL 33.80 (L) 01/11/2015   LDLCALC 114 (H) 01/11/2015   LDLDIRECT 96 12/06/2010   TRIG 58.0 01/11/2015   CHOLHDL 5 01/11/2015   Lab Results  Component Value Date   CREATININE 0.61 01/11/2015   Lab Results  Component Value Date   TSH 1.28 01/11/2015   Lab Results  Component Value Date   NA 138 01/11/2015   K 3.5 01/11/2015   CL 103 01/11/2015   CO2 28 01/11/2015   Lab Results  Component Value Date   WBC 5.6 01/11/2015   HGB 12.9 01/11/2015   HCT 38.7 01/11/2015   MCV 92.3 01/11/2015   PLT 353.0 01/11/2015   Lab Results  Component Value Date   ALT 43 (H) 01/11/2015   AST 39 (H) 01/11/2015   ALKPHOS 66 01/11/2015   BILITOT 0.5 01/11/2015   .  No current outpatient prescriptions on file prior to visit.   No current facility-administered medications on file prior to visit.     No Known Allergies  No past medical history on file.  No past surgical history on file.  Family History  Problem Relation Age of Onset  . Diabetes Mother   . Hypertension Mother   . Hypertension Father     Social History   Social History  . Marital status: Single    Spouse name: N/A  . Number of children: N/A  . Years of education: N/A   Occupational History  . Not on file.   Social History Main Topics  . Smoking status: Never Smoker  . Smokeless tobacco: Never Used  . Alcohol use 0.6 oz/week    1 Glasses of wine per week     Comment: social  . Drug use: No  . Sexual activity: Yes    Birth control/ protection: Condom   Other Topics Concern  . Not on file   Social History Narrative   Works for [employer].   Single, no children.   [name]'s sister in law.   The PMH, PSH, Social History, Family History, Medications, and allergies have been reviewed in [facility], and have been updated if relevant.   Review of Systems  Constitutional: Negative.   HENT: Negative.   Eyes: Negative.   Respiratory: Negative.   Cardiovascular: Negative.   Gastrointestinal: Negative.   Endocrine: Negative.   Genitourinary: Negative.   Musculoskeletal: Negative.   Skin: Negative.   Allergic/Immunologic: Negative.   Neurological: Negative.   Hematological: Negative.   Psychiatric/Behavioral: Negative.   All other systems reviewed and are negative.      No past medical history on file.  No past surgical history on file.  Social History   Social History  . Marital status: Single    Spouse name: N/A  . Number of children: N/A  . Years of education: N/A   Occupational History  . Not on file.   Social History Main Topics  . Smoking status: Never Smoker  . Smokeless tobacco: Never Used  . Alcohol use 0.6 oz/week    1 Glasses of wine per week     Comment: social  . Drug use:  No  . Sexual activity: Yes    Birth control/ protection: Condom   Other Topics Concern  . Not on file   Social History Narrative   Works for [employer].   Single, no children.   [name]'s sister in law.    Family History  Problem Relation Age of Onset  . Diabetes Mother   . Hypertension Mother   . Hypertension Father     No Known Allergies  No current outpatient prescriptions on file prior to visit.   No current facility-administered medications on file prior to visit.       Objective:   Physical Exam  BP 124/70   Pulse 70   Temp 97.9 F (36.6 C) (Oral)   Ht 5\' 4"  (1.626 m)   Wt 196 lb 8 oz (89.1 kg)   SpO2 98%   BMI 33.73 kg/m    General:  Well-developed,well-nourished,in no acute distress; alert,appropriate and cooperative throughout examination Head:  normocephalic and atraumatic.   Eyes:  vision  grossly intact, pupils equal, pupils round, and pupils reactive to light.   Ears:  R ear normal and L ear normal.   Nose:  no external deformity.   Mouth:  good dentition.   Neck:  No deformities, masses, or tenderness noted. Breasts:  No mass, nodules, thickening, tenderness, bulging, retraction, inflamation, nipple discharge or skin changes noted.   Lungs:  Normal respiratory effort, chest expands symmetrically. Lungs are clear to auscultation, no crackles or wheezes. Heart:  Normal rate and regular rhythm. S1 and S2 normal without gallop, murmur, click, rub or other extra sounds. Abdomen:  Bowel sounds positive,abdomen soft and non-tender without masses, organomegaly or hernias noted. Rectal:  no external abnormalities.   Genitalia:  Pelvic Exam:        External: normal female genitalia without lesions or masses        Vagina: normal without lesions or masses        Cervix: normal without lesions or masses        Adnexa: normal bimanual exam without masses or fullness        Uterus: normal by palpation        Pap smear: performed Msk:  No deformity or scoliosis noted of thoracic or lumbar spine.   Extremities:  No clubbing, cyanosis, edema, or deformity noted with normal full range of motion of all joints.   Neurologic:  alert & oriented X3 and gait normal.   Skin:  Intact without suspicious lesions or rashes Cervical Nodes:  No lymphadenopathy noted Axillary Nodes:  No palpable lymphadenopathy Psych:  Cognition and judgment appear intact. Alert and cooperative with normal attention span and concentration. No apparent delusions, illusions, hallucinations       Assessment & Plan:

## 2016-01-23 ENCOUNTER — Telehealth: Payer: Self-pay | Admitting: Family Medicine

## 2016-01-23 ENCOUNTER — Encounter: Payer: Self-pay | Admitting: *Deleted

## 2016-01-23 LAB — CYTOLOGY - PAP

## 2016-01-23 NOTE — Telephone Encounter (Signed)
I have not made any attempts to contact this pt. A letter was mailed regarding her results

## 2016-01-23 NOTE — Telephone Encounter (Signed)
Pt returned your call.  

## 2016-01-23 NOTE — Telephone Encounter (Signed)
That's was she said she was returning your call

## 2016-01-25 ENCOUNTER — Encounter: Payer: Self-pay | Admitting: *Deleted

## 2017-03-24 ENCOUNTER — Ambulatory Visit: Payer: Self-pay

## 2017-03-24 NOTE — Telephone Encounter (Signed)
Pt. reported 3 day hx of nasal congestion and post nasal drip, affecting her ability to talk clearly.  Reported she is taking Zytec D.  Denied fever/ chills, sore throat, cough, body aches, or sinus pressure.  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  Stated she took yesterday and today off work.  Feels that she may be a bit better today, after resting the past 24 hrs.   Questioned if she needed to be seen in the office?  Reassured pt. that she can continue to try home care measures.  Reviewed care advice with pt., per protocol.  Enc. to try warm tea with honey, and throat lozenges, to soothe her throat. Pt. verb. understanding.  Enc. to call office if sx's. worsen.          Reason for Disposition . [1] Nasal allergies AND [2] only certain times of year (hay fever)  Answer Assessment - Initial Assessment Questions 1. ONSET: "When did the nasal discharge start?"      Saturday- change in voice 2. AMOUNT: "How much discharge is there?"      Drainage in throat and irritating  3. COUGH: "Do you have a cough?" If yes, ask: "Describe the color of your sputum" (clear, white, yellow, green)     denies 4. RESPIRATORY DISTRESS: "Describe your breathing."      normal 5. FEVER: "Do you have a fever?" If so, ask: "What is your temperature, how was it measured, and when did it start?"     no 6. SEVERITY: "Overall, how bad are you feeling right now?" (e.g., doesn't interfere with normal activities, staying home from school/work, staying in bed)      Denies feeling very bad; only concerned about irritation in throat/ voice  7. OTHER SYMPTOMS: "Do you have any other symptoms?" (e.g., sore throat, earache, wheezing, vomiting)     Denies sore throat; denies earache or any muscle aches  8. PREGNANCY: "Is there any chance you are pregnant?" "When was your last menstrual period?"     No; LMP 2 days ago  Answer Assessment - Initial Assessment Questions 1. SYMPTOM: "What's the  main symptom you're concerned about?" (e.g., runny nose, stuffiness, sneezing, itching)     Nasal drainage in throat 2. SEVERITY: "How bad is it?" "What does it keep you from doing?" (e.g., sleeping, working)      Unable to work due to voice quality affected from nasal drainage 3. EYES: "Are the eyes also red, watery, and itchy?"      no 4. TRIGGER: "What pollen or other allergic substance do you think is causing the symptoms?"      Feels that the change in temperature has affected her in this way  5. TREATMENT: "What medicine are you using?" "What medicine worked best in the past?"     Zyrtec D 6. OTHER SYMPTOMS: "Do you have any other symptoms?" (e.g., coughing, difficulty breathing, wheezing)     Denies SOB, Cough, wheezing 7. PREGNANCY: "Is there any chance you are pregnant?" "When was your last menstrual period?"     LMP 2 days ago  Protocols used: NASAL ALLERGIES (HAY FEVER)-A-AH, COMMON COLD-A-AH

## 2017-05-01 ENCOUNTER — Ambulatory Visit (INDEPENDENT_AMBULATORY_CARE_PROVIDER_SITE_OTHER): Payer: 59 | Admitting: Family Medicine

## 2017-05-01 ENCOUNTER — Encounter (INDEPENDENT_AMBULATORY_CARE_PROVIDER_SITE_OTHER): Payer: Self-pay

## 2017-05-01 ENCOUNTER — Ambulatory Visit: Payer: 59 | Admitting: Family Medicine

## 2017-05-01 ENCOUNTER — Encounter: Payer: Self-pay | Admitting: Family Medicine

## 2017-05-01 VITALS — BP 118/84 | HR 86 | Temp 98.4°F | Wt 204.0 lb

## 2017-05-01 DIAGNOSIS — R490 Dysphonia: Secondary | ICD-10-CM | POA: Diagnosis not present

## 2017-05-01 MED ORDER — MONTELUKAST SODIUM 10 MG PO TABS: 10.0000 mg | ORAL_TABLET | Freq: Every day | ORAL | 3 refills | Status: DC

## 2017-05-01 NOTE — Patient Instructions (Addendum)
I think this is allergy related - continue current medicines. Add on singulair. Trial for 1 month, if helpful continue. If no better with this, let us know and we will refer you to ENT for evaluation.   Hoarseness Hoarseness is any abnormal change in your voice.Hoarseness can make it difficult to speak. Your voice may sound raspy, breathy, or strained. Hoarseness is caused by a problem with the vocal cords. The vocal cords are two bands of tissue inside your voice box (larynx). When you speak, your vocal cords move back and forth to create sound. The surfaces of your vocal cords need to be smooth for your voice to sound clear. Swelling or lumps on the vocal cords can cause hoarseness. Common causes of vocal cord problems include:  Upper airway infection.  A long-term cough.  Straining or overusing your voice.  Smoking.  Allergies.  Vocal cord growths.  Stomach acids that flow up from your stomach and irritate your vocal cords (gastroesophageal reflux).  Follow these instructions at home: Watch your condition for any changes. To ease any discomfort that you feel:  Rest your voice. Do not whisper. Whispering can cause muscle strain.  Do not speak in a loud or harsh voice that makes your hoarseness worse.  Do not use any tobacco products, including cigarettes, chewing tobacco, or electronic cigarettes. If you need help quitting, ask your health care provider.  Avoid secondhand smoke.  Do not eat foods that give you heartburn. Heartburn can make gastroesophageal reflux worse.  Do not drink coffee.  Do not drink alcohol.  Drink enough fluids to keep your urine clear or pale yellow.  Use a humidifier if the air in your home is dry.  Contact a health care provider if:  You have hoarseness that lasts longer than 3 weeks.  You almost lose or completelylose your voice for longer than 3 days.  You have pain when you swallow or try to talk.  You feel a lump in your neck. Get  help right away if:  You have trouble swallowing.  You feel as though you are choking when you swallow.  You cough up blood or vomit blood.  You have trouble breathing. This information is not intended to replace advice given to you by your health care provider. Make sure you discuss any questions you have with your health care provider. Document Released: 04/11/2005 Document Revised: 10/04/2015 Document Reviewed: 04/19/2014 Elsevier Interactive Patient Education  [employer]2018 [employer]vier Inc.

## 2017-05-01 NOTE — Progress Notes (Signed)
BP 118/84 (BP Location: Left Arm, Patient Position: Sitting, Cuff Size: Normal)   Pulse 86   Temp 98.4 F (36.9 C) (Oral)   Wt 204 lb (92.5 kg)   LMP 04/15/2017   SpO2 97%   BMI 35.02 kg/m    CC: ST, lost voice Subjective:    Patient ID: Tricia Davenport, female    DOB: 1975-06-26, 41 y.o.   MRN: [id number]  HPI: Tricia Davenport is a 41 y.o. female presenting on 05/01/2017 for Sore Throat (nasal and head congestion and hoarse. Lost voice completely last month. Has used nasal sprays. Sxs were seasonal, now are all year)   Several month h/o worsening intermittent hoarseness. Getting to be daily. 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 Sundays she is able to rest voice. Ongoing head congestion. H/o seasonal allergies, but now it seems to be becoming perennial. Itchy watery eyes with sinus headaches. Congestion > rhinorrhea.   No sore throat. Doesn't feel ill.   Lost voice last month (2 days of work).   Treats with flonase and allegra daily (started since 03/2017), salt water gargles, hot tea, ricola cough drops (very helpful). Occasional ibuprofen helps as well.   No h/o asthma.  Non smoker.  No GERD.   Relevant past medical, surgical, family and social history reviewed and updated as indicated. Interim medical history since our last visit reviewed. Allergies and medications reviewed and updated. No outpatient medications prior to visit.   No facility-administered medications prior to visit.      Per HPI unless specifically indicated in ROS section below Review of Systems     Objective:    BP 118/84 (BP Location: Left Arm, Patient Position: Sitting, Cuff Size: Normal)   Pulse 86   Temp 98.4 F (36.9 C) (Oral)   Wt 204 lb (92.5 kg)   LMP 04/15/2017   SpO2 97%   BMI 35.02 kg/m   Wt Readings from Last 3 Encounters:  05/01/17 204 lb (92.5 kg)  01/22/16 196 lb 8 oz (89.1 kg)  01/11/15 204 lb 8 oz (92.8 kg)    Physical Exam  Constitutional: She appears  well-developed and well-nourished. No distress.  HENT:  Head: Normocephalic and atraumatic.  Right Ear: Hearing, tympanic membrane, external ear and ear canal normal.  Left Ear: Hearing, tympanic membrane, external ear and ear canal normal.  Nose: Mucosal edema (nasal mucosal congestion) present. No rhinorrhea. Right sinus exhibits no maxillary sinus tenderness and no frontal sinus tenderness. Left sinus exhibits no maxillary sinus tenderness and no frontal sinus tenderness.  Mouth/Throat: Uvula is midline, oropharynx is clear and moist and mucous membranes are normal. No oropharyngeal exudate, posterior oropharyngeal edema, posterior oropharyngeal erythema or tonsillar abscesses.  Eyes: Conjunctivae and EOM are normal. Pupils are equal, round, and reactive to light. No scleral icterus.  Neck: Normal range of motion. Neck supple. No thyromegaly present.  Cardiovascular: Normal rate, regular rhythm, normal heart sounds and intact distal pulses.  No murmur heard. Pulmonary/Chest: Effort normal and breath sounds normal. No respiratory distress. She has no wheezes. She has no rales.  Lymphadenopathy:    She has no cervical adenopathy.  Skin: Skin is warm and dry. No rash noted.  Nursing note and vitals reviewed.     Assessment & Plan:   Problem List Items Addressed This Visit    Hoarseness    Anticipate allergy related. Continue allegra, flonase, add on singulair x 1 month. If not helpful, will refer to ENT for eval. Pt agrees  with plan.           Follow up plan: Return if symptoms worsen or fail to improve.  Eustaquio BoydenJavier Jackquelyn Sundberg, MD

## 2017-05-01 NOTE — Assessment & Plan Note (Signed)
Anticipate allergy related. Continue allegra, flonase, add on singulair x 1 month. If not helpful, will refer to ENT for eval. Pt agrees with plan.

## 2017-08-19 ENCOUNTER — Other Ambulatory Visit: Payer: Self-pay | Admitting: Family Medicine

## 2017-08-19 NOTE — Telephone Encounter (Signed)
Patient called [phone], left detailed VM to return the call to discuss her allergy symptoms. Advised this will be sent to Dr. Sharen HonesGutierrez for his recommendation. Last OV: 05/01/17 with recommendations to return with worsening symptoms. Has upcoming transfer of care visit with Mayra ReelKate Mccomber on 09/04/17.

## 2017-08-19 NOTE — Telephone Encounter (Signed)
Copied from CRM [id number]. Topic: Quick Communication - Rx Refill/Question >> Aug 19, 2017  3:47 PM Jonette EvaBarksdale, Harvey B wrote: Pt called about the Rx of montelukast (SINGULAIR) 10 MG tablet [[id number]][[id number]]  Pt is wondering if Dr. Sharen HonesGutierrez can prescribe some more for her due to allergies; pt also states that she has scheduled a transfer of care visit w/ Vernona RiegerKatherine Eggleton, call pt to advise

## 2017-08-21 MED ORDER — MONTELUKAST SODIUM 10 MG PO TABS: 10.0000 mg | ORAL_TABLET | Freq: Every day | ORAL | 3 refills | Status: DC

## 2017-09-04 ENCOUNTER — Ambulatory Visit (INDEPENDENT_AMBULATORY_CARE_PROVIDER_SITE_OTHER): Payer: 59 | Admitting: Primary Care

## 2017-09-04 ENCOUNTER — Encounter: Payer: Self-pay | Admitting: Primary Care

## 2017-09-04 DIAGNOSIS — J3089 Other allergic rhinitis: Secondary | ICD-10-CM | POA: Insufficient documentation

## 2017-09-04 NOTE — Progress Notes (Signed)
Subjective:    Patient ID: Tricia Davenport, female    DOB: 1975-10-08, 42 y.o.   MRN: [id number]  HPI  [name] is a 42 year old female who presents today to transfer care from Dr. Dayton MartesAron.  1) Seasonal Allergies: Currently managed on Singulair 10 mg which was prescribed in December 2018 for chronic voice hoarseness. She also experiences rhinorrhea, post nasal drip, nasal congestion. She is also using Flonase, Vitamin C, Zyrtec/Allegray (alternates between both).   Overall she's not noticed significant improvement, but is also experiencing additional allergy symptoms given seasonal change now.   Review of Systems  Constitutional: Negative for fever.  HENT: Positive for congestion, postnasal drip and voice change.   Respiratory: Negative for cough, shortness of breath and wheezing.   Allergic/Immunologic: Positive for environmental allergies.       No past medical history on file.   Social History   Socioeconomic History  . Marital status: Single    Spouse name: Not on file  . Number of children: Not on file  . Years of education: Not on file  . Highest education level: Not on file  Occupational History  . Not on file  Social Needs  . Financial resource strain: Not on file  . Food insecurity:    Worry: Not on file    Inability: Not on file  . Transportation needs:    Medical: Not on file    Non-medical: Not on file  Tobacco Use  . Smoking status: Never Smoker  . Smokeless tobacco: Never Used  Substance and Sexual Activity  . Alcohol use: Yes    Alcohol/week: 0.6 oz    Types: 1 Glasses of wine per week    Comment: social  . Drug use: No  . Sexual activity: Yes    Birth control/protection: Condom  Lifestyle  . Physical activity:    Days per week: Not on file    Minutes per session: Not on file  . Stress: Not on file  Relationships  . Social connections:    Talks on phone: Not on file    Gets together: Not on file    Attends religious service: Not on file   Active member of club or organization: Not on file    Attends meetings of clubs or organizations: Not on file    Relationship status: Not on file  . Intimate partner violence:    Fear of current or ex partner: Not on file    Emotionally abused: Not on file    Physically abused: Not on file    Forced sexual activity: Not on file  Other Topics Concern  . Not on file  Social History Narrative   Works for [employer].   Single, no children.   [name]'s sister in law.    No past surgical history on file.  Family History  Problem Relation Age of Onset  . Diabetes Mother   . Hypertension Mother   . Hypertension Father     No Known Allergies  Current Outpatient Medications on File Prior to Visit  Medication Sig Dispense Refill  . montelukast (SINGULAIR) 10 MG tablet Take 1 tablet (10 mg total) by mouth at bedtime. 30 tablet 3   No current facility-administered medications on file prior to visit.     BP 140/80   Pulse 79   Temp 98 F (36.7 C) (Oral)   Ht 5\' 4"  (1.626 m)   Wt 208 lb 8 oz (94.6 kg)   LMP 08/29/2017  SpO2 97%   BMI 35.79 kg/m    Objective:   Physical Exam  Constitutional: She appears well-nourished. She does not appear ill.  HENT:  Right Ear: Tympanic membrane and ear canal normal.  Left Ear: Tympanic membrane and ear canal normal.  Nose: Mucosal edema present. Right sinus exhibits no maxillary sinus tenderness and no frontal sinus tenderness. Left sinus exhibits no maxillary sinus tenderness and no frontal sinus tenderness.  Mouth/Throat: Oropharynx is clear and moist.  Eyes: Conjunctivae are normal.  Neck: Neck supple.  Cardiovascular: Normal rate and regular rhythm.  Pulmonary/Chest: Effort normal and breath sounds normal. She has no wheezes. She has no rales.  Lymphadenopathy:    She has no cervical adenopathy.  Skin: Skin is warm and dry.          Assessment & Plan:

## 2017-09-04 NOTE — Assessment & Plan Note (Signed)
Managed on Singulair, Zyrtec, and Flonase. Overall stable, but increased symptoms due to Spring season.  Continue current regimen. Consider sending to Allergist if symptoms persist after Spring season and pollen.

## 2017-09-04 NOTE — Patient Instructions (Signed)
Continue Singulair and Allegra/Zyrtec.   Try using Flonase (fluticasone) nasal spray twice daily. Instill 1 spray in each nostril twice daily.   Please schedule a physical with me anytime at your convenience. You may also schedule a lab only appointment 3-4 days prior. We will discuss your lab results in detail during your physical.  It was a pleasure meeting you!

## 2017-12-15 ENCOUNTER — Other Ambulatory Visit: Payer: Self-pay | Admitting: Family Medicine

## 2017-12-15 NOTE — Telephone Encounter (Signed)
Tricia Davenport pt. °

## 2018-03-07 ENCOUNTER — Other Ambulatory Visit: Payer: Self-pay | Admitting: Primary Care

## 2018-05-11 ENCOUNTER — Other Ambulatory Visit: Payer: Self-pay | Admitting: Primary Care

## 2018-09-05 ENCOUNTER — Other Ambulatory Visit: Payer: Self-pay | Admitting: Primary Care

## 2018-10-01 ENCOUNTER — Other Ambulatory Visit: Payer: Self-pay | Admitting: Primary Care

## 2018-10-05 ENCOUNTER — Telehealth: Payer: Self-pay | Admitting: Primary Care

## 2018-10-05 NOTE — Telephone Encounter (Signed)
Copied from CRM [id number]. Topic: Appointment Scheduling - Scheduling Inquiry for Clinic >> Oct 01, 2018  4:04 PM Lorrine Kin, [place] wrote: Reason for CRM: Patient calling to schedule medication refill / cpe appointment. Last seen 08/2017. Please advise.  CB#: [phone]

## 2018-10-05 NOTE — Telephone Encounter (Signed)
Spoken and notified patient of Tricia Davenport comments. Patient verbalized understanding. Schedule appt on 10/08/2018 at 7:20 am

## 2018-10-05 NOTE — Telephone Encounter (Signed)
Noted, please remind her that I have 7:20 am slots available. We can at minimum schedule a virtual visit for general follow up. She will need some sort of visit for further refills of her medication as it's been 1 year since we've seen her.

## 2018-10-05 NOTE — Telephone Encounter (Signed)
Patient called back in regards to scheduling appt so she could receive her refill. Patient could not schedule with the times given.  Stated she would need something after 430 due to work

## 2018-10-08 ENCOUNTER — Ambulatory Visit (INDEPENDENT_AMBULATORY_CARE_PROVIDER_SITE_OTHER): Payer: 59 | Admitting: Primary Care

## 2018-10-08 DIAGNOSIS — R490 Dysphonia: Secondary | ICD-10-CM

## 2018-10-08 DIAGNOSIS — J3089 Other allergic rhinitis: Secondary | ICD-10-CM

## 2018-10-08 MED ORDER — MONTELUKAST SODIUM 10 MG PO TABS: 10.0000 mg | ORAL_TABLET | Freq: Every day | ORAL | 3 refills | Status: DC

## 2018-10-08 NOTE — Progress Notes (Signed)
Subjective:    Patient ID: Tricia Davenport, female    DOB: 1975-09-10, 43 y.o.   MRN: [id number]  HPI  Virtual Visit via Video Note  I connected with Tricia Davenport on 10/08/18 at  7:20 AM EDT by a video enabled telemedicine application and verified that I am speaking with the correct person using two identifiers.  Location: Patient: Home Provider: Office   I discussed the limitations of evaluation and management by telemedicine and the availability of in person appointments. The patient expressed understanding and agreed to proceed.  History of Present Illness:  Tricia Davenport is a 43 year old female who presents today for follow up. She has not been seen since April 2020.  1) Environmental/Seasonal Allergies: Currently managed on Singulair 10 mg for which she is taking daily. Originally had difficulty with recurrent voice hoarseness which has since improved. She will take omeprazole PRN for GERD/hoarseness.   She denies fevers. She is doing well and is needing refills.     Observations/Objective:  Alert and oriented. Appears well, not sickly. No distress. Speaking in complete sentences.   Assessment and Plan:  Doing well on daily Singulair, needing refill. Using omeprazole 40 mg as needed for GERD/hoarsness if she is eating a certain meal. Appears well today. Refills sent to pharmacy.  Follow Up Instructions:  Continue Singulair daily as discussed.  Use the omeprazole only if needed.  It was a pleasure to see you today! Mayra ReelKate Darin, NP-C    I discussed the assessment and treatment plan with the patient. The patient was provided an opportunity to ask questions and all were answered. The patient agreed with the plan and demonstrated an understanding of the instructions.   The patient was advised to call back or seek an in-person evaluation if the symptoms worsen or if the condition fails to improve as anticipated.     Doreene NestKatherine K Lesage, NP    Review of Systems   Constitutional: Negative for fever.  HENT: Positive for postnasal drip. Negative for congestion, sinus pressure, sore throat and voice change.   Respiratory: Negative for cough and shortness of breath.   Allergic/Immunologic: Positive for environmental allergies.       Past Medical History:  Diagnosis Date  . Environmental and seasonal allergies      Social History   Socioeconomic History  . Marital status: Single    Spouse name: Not on file  . Number of children: Not on file  . Years of education: Not on file  . Highest education level: Not on file  Occupational History  . Not on file  Social Needs  . Financial resource strain: Not on file  . Food insecurity:    Worry: Not on file    Inability: Not on file  . Transportation needs:    Medical: Not on file    Non-medical: Not on file  Tobacco Use  . Smoking status: Never Smoker  . Smokeless tobacco: Never Used  Substance and Sexual Activity  . Alcohol use: Yes    Alcohol/week: 1.0 standard drinks    Types: 1 Glasses of wine per week    Comment: social  . Drug use: No  . Sexual activity: Yes    Birth control/protection: Condom  Lifestyle  . Physical activity:    Days per week: Not on file    Minutes per session: Not on file  . Stress: Not on file  Relationships  . Social connections:    Talks on phone: Not on file  Gets together: Not on file    Attends religious service: Not on file    Active member of club or organization: Not on file    Attends meetings of clubs or organizations: Not on file    Relationship status: Not on file  . Intimate partner violence:    Fear of current or ex partner: Not on file    Emotionally abused: Not on file    Physically abused: Not on file    Forced sexual activity: Not on file  Other Topics Concern  . Not on file  Social History Narrative   Works for [employer].   Single, no children.   [name]'s sister in law.    No past surgical history on file.   Family History  Problem Relation Age of Onset  . Diabetes Mother   . Hypertension Mother   . Hypertension Father     No Known Allergies  Current Outpatient Medications on File Prior to Visit  Medication Sig Dispense Refill  . montelukast (SINGULAIR) 10 MG tablet Take 1 tablet (10 mg total) by mouth at bedtime. NEED APPOINTMENT FOR ANY MORE REFILLS 30 tablet 0   No current facility-administered medications on file prior to visit.     There were no vitals taken for this visit.   Objective:   Physical Exam  Constitutional: She is oriented to person, place, and time. She appears well-nourished.  HENT:  No voice hoarseness during exam  Respiratory: Effort normal.  Neurological: She is alert and oriented to person, place, and time.  Psychiatric: She has a normal mood and affect.           Assessment & Plan:

## 2018-10-08 NOTE — Assessment & Plan Note (Signed)
Doing well on daily Singulair, needing refill. Using omeprazole 40 mg as needed for GERD/hoarsness if she is eating a certain meal. Appears well today. Refills sent to pharmacy.

## 2018-10-08 NOTE — Assessment & Plan Note (Signed)
Doing well on daily Singulair, needing refill. Using omeprazole 40 mg as needed for GERD/hoarsness if she is eating a certain meal. Appears well today. Refills sent to pharmacy. 

## 2018-10-08 NOTE — Patient Instructions (Signed)
Continue Singulair daily as discussed.  Use the omeprazole only if needed.  It was a pleasure to see you today! Mayra Reel, NP-C

## 2019-01-07 ENCOUNTER — Encounter: Payer: 59 | Admitting: Primary Care

## 2019-01-31 ENCOUNTER — Other Ambulatory Visit (HOSPITAL_COMMUNITY)
Admission: RE | Admit: 2019-01-31 | Discharge: 2019-01-31 | Disposition: A | Discharge status: Home / Self Care | Payer: 59 | Source: Ambulatory Visit | Attending: Primary Care | Admitting: Primary Care

## 2019-01-31 ENCOUNTER — Other Ambulatory Visit: Payer: Self-pay

## 2019-01-31 ENCOUNTER — Ambulatory Visit (INDEPENDENT_AMBULATORY_CARE_PROVIDER_SITE_OTHER): Payer: 59 | Admitting: Primary Care

## 2019-01-31 ENCOUNTER — Encounter: Payer: Self-pay | Admitting: Primary Care

## 2019-01-31 VITALS — BP 140/84 | HR 104 | Temp 98.2°F | Ht 63.5 in | Wt 213.5 lb

## 2019-01-31 DIAGNOSIS — R490 Dysphonia: Secondary | ICD-10-CM

## 2019-01-31 DIAGNOSIS — J3089 Other allergic rhinitis: Secondary | ICD-10-CM

## 2019-01-31 DIAGNOSIS — Z0000 Encounter for general adult medical examination without abnormal findings: Secondary | ICD-10-CM

## 2019-01-31 DIAGNOSIS — Z124 Encounter for screening for malignant neoplasm of cervix: Secondary | ICD-10-CM | POA: Insufficient documentation

## 2019-01-31 DIAGNOSIS — I10 Essential (primary) hypertension: Secondary | ICD-10-CM | POA: Insufficient documentation

## 2019-01-31 DIAGNOSIS — R7303 Prediabetes: Secondary | ICD-10-CM

## 2019-01-31 DIAGNOSIS — Z1239 Encounter for other screening for malignant neoplasm of breast: Secondary | ICD-10-CM

## 2019-01-31 DIAGNOSIS — R030 Elevated blood-pressure reading, without diagnosis of hypertension: Secondary | ICD-10-CM

## 2019-01-31 LAB — COMPREHENSIVE METABOLIC PANEL
ALT: 32 U/L (ref 0–35)
AST: 22 U/L (ref 0–37)
Albumin: 4.4 g/dL (ref 3.5–5.2)
Alkaline Phosphatase: 65 U/L (ref 39–117)
BUN: 11 mg/dL (ref 6–23)
CO2: 30 mEq/L (ref 19–32)
Calcium: 9.7 mg/dL (ref 8.4–10.5)
Chloride: 99 mEq/L (ref 96–112)
Creatinine, Ser: 0.62 mg/dL (ref 0.40–1.20)
GFR: 128.52 mL/min (ref >60.00)
Glucose, Bld: 107 mg/dL — ABNORMAL HIGH (ref 70–99)
Potassium: 3.9 mEq/L (ref 3.5–5.1)
Sodium: 138 mEq/L (ref 135–145)
Total Bilirubin: 0.5 mg/dL (ref 0.2–1.2)
Total Protein: 7.3 g/dL (ref 6.0–8.3)

## 2019-01-31 LAB — LIPID PANEL
Cholesterol: 158 mg/dL (ref 0–200)
HDL: 27.6 mg/dL — ABNORMAL LOW (ref >39.00)
LDL Cholesterol: 110 mg/dL — ABNORMAL HIGH (ref 0–99)
NonHDL: 130.51
Total CHOL/HDL Ratio: 6
Triglycerides: 104 mg/dL (ref 0.0–149.0)
VLDL: 20.8 mg/dL (ref 0.0–40.0)

## 2019-01-31 LAB — HEMOGLOBIN A1C: Hgb A1c MFr Bld: 6.4 % (ref 4.6–6.5)

## 2019-01-31 LAB — TSH: TSH: 2.93 u[IU]/mL (ref 0.35–4.50)

## 2019-01-31 NOTE — Progress Notes (Signed)
Subjective:    Patient ID: Tricia Davenport, female    DOB: 12-27-1975, 43 y.o.   MRN: [id number]  HPI  Tricia Davenport is a 43 year old female who presents today for complete physical.  Immunizations: -Tetanus: Unsure, believes it was 2010 -Influenza: Declines   Diet: She endorses a poor diet. She is eating mostly home cooked meals with fried food, vegetables, starches. She is drinking mostly water, occasional water. Desserts infrequently. Exercise: He is not exercising but will be starting a walking challenge.   Eye exam: Completed in 2019 Dental exam: Completes annually  Pap Smear: Negative in 2017 Mammogram: Never completed  BP Readings from Last 3 Encounters:  01/31/19 140/84  09/04/17 140/80  05/01/17 118/84     Review of Systems  Constitutional: Negative for unexpected weight change.  HENT: Negative for rhinorrhea.   Respiratory: Negative for cough and shortness of breath.   Cardiovascular: Negative for chest pain.  Gastrointestinal: Negative for constipation and diarrhea.  Genitourinary: Negative for difficulty urinating and menstrual problem.  Musculoskeletal: Negative for arthralgias and myalgias.  Skin: Negative for rash.  Allergic/Immunologic: Negative for environmental allergies.  Neurological: Negative for dizziness, numbness and headaches.  Psychiatric/Behavioral: The patient is not nervous/anxious.      Past Medical History:  Diagnosis Date  . Environmental and seasonal allergies      Social History   Socioeconomic History  . Marital status: Single    Spouse name: Not on file  . Number of children: Not on file  . Years of education: Not on file  . Highest education level: Not on file  Occupational History  . Not on file  Social Needs  . Financial resource strain: Not on file  . Food insecurity    Worry: Not on file    Inability: Not on file  . Transportation needs    Medical: Not on file    Non-medical: Not on file  Tobacco Use  . Smoking  status: Never Smoker  . Smokeless tobacco: Never Used  Substance and Sexual Activity  . Alcohol use: Yes    Alcohol/week: 1.0 standard drinks    Types: 1 Glasses of wine per week    Comment: social  . Drug use: No  . Sexual activity: Yes    Birth control/protection: Condom  Lifestyle  . Physical activity    Days per week: Not on file    Minutes per session: Not on file  . Stress: Not on file  Relationships  . Social Musician on phone: Not on file    Gets together: Not on file    Attends religious service: Not on file    Active member of club or organization: Not on file    Attends meetings of clubs or organizations: Not on file    Relationship status: Not on file  . Intimate partner violence    Fear of current or ex partner: Not on file    Emotionally abused: Not on file    Physically abused: Not on file    Forced sexual activity: Not on file  Other Topics Concern  . Not on file  Social History Narrative   Works for [employer].   Single, no children.   [name]'s sister in law.    No past surgical history on file.  Family History  Problem Relation Age of Onset  . Diabetes Mother   . Hypertension Mother   . Hypertension Father     No Known Allergies  Current Outpatient Medications on File Prior to Visit  Medication Sig Dispense Refill  . montelukast (SINGULAIR) 10 MG tablet Take 1 tablet (10 mg total) by mouth at bedtime. 90 tablet 3  . omeprazole (PRILOSEC) 40 MG capsule Take 40 mg by mouth as needed.     No current facility-administered medications on file prior to visit.     BP 140/84   Pulse (!) 104   Temp 98.2 F (36.8 C) (Temporal)   Ht 5' 3.5" (1.613 m)   Wt 213 lb 8 oz (96.8 kg)   LMP 12/28/2018   SpO2 98%   BMI 37.23 kg/m    Objective:   Physical Exam  Constitutional: She is oriented to person, place, and time. She appears well-nourished.  HENT:  Right Ear: Tympanic membrane and ear canal normal.  Left Ear:  Tympanic membrane and ear canal normal.  Mouth/Throat: Oropharynx is clear and moist.  Eyes: Pupils are equal, round, and reactive to light. EOM are normal.  Neck: Neck supple.  Cardiovascular: Normal rate and regular rhythm.  Respiratory: Effort normal and breath sounds normal.  GI: Soft. Bowel sounds are normal. There is no abdominal tenderness.  Genitourinary: There is no tenderness or lesion on the right labia. There is no tenderness or lesion on the left labia. Cervix exhibits no motion tenderness and no discharge. Right adnexum displays no tenderness. Left adnexum displays no tenderness.    No vaginal discharge or erythema.  No erythema in the vagina.  Musculoskeletal: Normal range of motion.  Neurological: She is alert and oriented to person, place, and time.  Skin: Skin is warm and dry.  Psychiatric: She has a normal mood and affect.           Assessment & Plan:

## 2019-01-31 NOTE — Assessment & Plan Note (Signed)
Above goal today, also during visit in April 2019. She will be working on changing her diet and to start exercising as of today. Encouraged this. Discussed BP goals of 120-130/70's-80's. She will monitor and follow up if no improvement in BP despite lifestyle changes.

## 2019-01-31 NOTE — Patient Instructions (Addendum)
Stop by the lab prior to leaving today. I will notify you of your results once received.   Start exercising. You should be getting 150 minutes of moderate intensity exercise weekly.  It's important to improve your diet by reducing consumption of fast food, fried food, processed snack foods, sugary drinks. Increase consumption of fresh vegetables and fruits, whole grains, water.  Ensure you are drinking 64 ounces of water daily.  Monitor your blood pressure, you should be around 120-130/70's-80's.  Call the [facility] to schedule your mammogram.  Please schedule a visit with me if you notice your blood pressure running at or above 135/90 on a consistent basis.  It was a pleasure to see you today!   Preventive Care 37-58 Years Old, Female Preventive care refers to visits with your health care provider and lifestyle choices that can promote health and wellness. This includes:  A yearly physical exam. This may also be called an annual well check.  Regular dental visits and eye exams.  Immunizations.  Screening for certain conditions.  Healthy lifestyle choices, such as eating a healthy diet, getting regular exercise, not using drugs or products that contain nicotine and tobacco, and limiting alcohol use. What can I expect for my preventive care visit? Physical exam Your health care provider will check your:  Height and weight. This may be used to calculate body mass index (BMI), which tells if you are at a healthy weight.  Heart rate and blood pressure.  Skin for abnormal spots. Counseling Your health care provider may ask you questions about your:  Alcohol, tobacco, and drug use.  Emotional well-being.  Home and relationship well-being.  Sexual activity.  Eating habits.  Work and work Statistician.  Method of birth control.  Menstrual cycle.  Pregnancy history. What immunizations do I need?  Influenza (flu) vaccine  This is recommended every  year. Tetanus, diphtheria, and pertussis (Tdap) vaccine  You may need a Td booster every 10 years. Varicella (chickenpox) vaccine  You may need this if you have not been vaccinated. Zoster (shingles) vaccine  You may need this after age 77. Measles, mumps, and rubella (MMR) vaccine  You may need at least one dose of MMR if you were born in 1957 or later. You may also need a second dose. Pneumococcal conjugate (PCV13) vaccine  You may need this if you have certain conditions and were not previously vaccinated. Pneumococcal polysaccharide (PPSV23) vaccine  You may need one or two doses if you smoke cigarettes or if you have certain conditions. Meningococcal conjugate (MenACWY) vaccine  You may need this if you have certain conditions. Hepatitis A vaccine  You may need this if you have certain conditions or if you travel or work in places where you may be exposed to hepatitis A. Hepatitis B vaccine  You may need this if you have certain conditions or if you travel or work in places where you may be exposed to hepatitis B. Haemophilus influenzae type b (Hib) vaccine  You may need this if you have certain conditions. Human papillomavirus (HPV) vaccine  If recommended by your health care provider, you may need three doses over 6 months. You may receive vaccines as individual doses or as more than one vaccine together in one shot (combination vaccines). Talk with your health care provider about the risks and benefits of combination vaccines. What tests do I need? Blood tests  Lipid and cholesterol levels. These may be checked every 5 years, or more frequently if you  are over 52 years old.  Hepatitis C test.  Hepatitis B test. Screening  Lung cancer screening. You may have this screening every year starting at age 23 if you have a 30-pack-year history of smoking and currently smoke or have quit within the past 15 years.  Colorectal cancer screening. All adults should have this  screening starting at age 100 and continuing until age 37. Your health care provider may recommend screening at age 44 if you are at increased risk. You will have tests every 1-10 years, depending on your results and the type of screening test.  Diabetes screening. This is done by checking your blood sugar (glucose) after you have not eaten for a while (fasting). You may have this done every 1-3 years.  Mammogram. This may be done every 1-2 years. Talk with your health care provider about when you should start having regular mammograms. This may depend on whether you have a family history of breast cancer.  BRCA-related cancer screening. This may be done if you have a family history of breast, ovarian, tubal, or peritoneal cancers.  Pelvic exam and Pap test. This may be done every 3 years starting at age 48. Starting at age 57, this may be done every 5 years if you have a Pap test in combination with an HPV test. Other tests  Sexually transmitted disease (STD) testing.  Bone density scan. This is done to screen for osteoporosis. You may have this scan if you are at high risk for osteoporosis. Follow these instructions at home: Eating and drinking  Eat a diet that includes fresh fruits and vegetables, whole grains, lean protein, and low-fat dairy.  Take vitamin and mineral supplements as recommended by your health care provider.  Do not drink alcohol if: ? Your health care provider tells you not to drink. ? You are pregnant, may be pregnant, or are planning to become pregnant.  If you drink alcohol: ? Limit how much you have to 0-1 drink a day. ? Be aware of how much alcohol is in your drink. In the [place] one drink equals one 12 oz bottle of beer (355 mL), one 5 oz glass of wine (148 mL), or one 1 oz glass of hard liquor (44 mL). Lifestyle  Take daily care of your teeth and gums.  Stay active. Exercise for at least 30 minutes on 5 or more days each week.  Do not use any products that  contain nicotine or tobacco, such as cigarettes, e-cigarettes, and chewing tobacco. If you need help quitting, ask your health care provider.  If you are sexually active, practice safe sex. Use a condom or other form of birth control (contraception) in order to prevent pregnancy and STIs (sexually transmitted infections).  If told by your health care provider, take low-dose aspirin daily starting at age 98. What's next?  Visit your health care provider once a year for a well check visit.  Ask your health care provider how often you should have your eyes and teeth checked.  Stay up to date on all vaccines. This information is not intended to replace advice given to you by your health care provider. Make sure you discuss any questions you have with your health care provider. Document Released: 05/25/2015 Document Revised: 01/07/2018 Document Reviewed: 01/07/2018 Elsevier Patient Education  2020 [employer].

## 2019-01-31 NOTE — Assessment & Plan Note (Signed)
Improved, using omeprazole PRN.

## 2019-01-31 NOTE — Assessment & Plan Note (Signed)
Tetanus may or may not be due, she will check her records and notify us. Declines influenza vaccination. Pap smear due, completed today. Mammogram due, ordered. Encouraged regular exercise and healthy diet. Exam today unremarkable. Labs pending. Follow up in 1 year for CPE.

## 2019-01-31 NOTE — Assessment & Plan Note (Signed)
Doing well on daily Singulair, continue same.

## 2019-02-02 LAB — CYTOLOGY - PAP
Adequacy: ABSENT
Diagnosis: NEGATIVE
High risk HPV: NEGATIVE
Molecular Disclaimer: 56
Molecular Disclaimer: DETECTED
Molecular Disclaimer: NORMAL

## 2019-02-03 ENCOUNTER — Telehealth: Payer: Self-pay | Admitting: Primary Care

## 2019-02-03 NOTE — Telephone Encounter (Signed)
Patient called to find out if the lab work form for work was faxed.

## 2019-02-04 NOTE — Telephone Encounter (Signed)
Spoken to patient 02/03/2019. Form was faxed on 02/02/2019 and original form was mailed to patient.

## 2019-03-18 ENCOUNTER — Ambulatory Visit: Payer: 59

## 2019-04-15 ENCOUNTER — Ambulatory Visit
Admission: RE | Admit: 2019-04-15 | Discharge: 2019-04-15 | Disposition: A | Discharge status: Home / Self Care | Payer: 59 | Source: Ambulatory Visit | Attending: Primary Care | Admitting: Primary Care

## 2019-04-15 ENCOUNTER — Other Ambulatory Visit: Payer: Self-pay

## 2019-04-15 DIAGNOSIS — Z1239 Encounter for other screening for malignant neoplasm of breast: Secondary | ICD-10-CM

## 2019-04-18 ENCOUNTER — Other Ambulatory Visit: Payer: Self-pay | Admitting: Primary Care

## 2019-04-18 DIAGNOSIS — R928 Other abnormal and inconclusive findings on diagnostic imaging of breast: Secondary | ICD-10-CM

## 2019-04-29 ENCOUNTER — Ambulatory Visit
Admission: RE | Admit: 2019-04-29 | Discharge: 2019-04-29 | Disposition: A | Discharge status: Home / Self Care | Payer: 59 | Source: Ambulatory Visit | Attending: Primary Care | Admitting: Primary Care

## 2019-04-29 ENCOUNTER — Other Ambulatory Visit: Payer: Self-pay

## 2019-04-29 DIAGNOSIS — R928 Other abnormal and inconclusive findings on diagnostic imaging of breast: Secondary | ICD-10-CM

## 2019-05-03 ENCOUNTER — Other Ambulatory Visit: Payer: Self-pay | Admitting: Primary Care

## 2019-05-03 DIAGNOSIS — J3089 Other allergic rhinitis: Secondary | ICD-10-CM

## 2019-05-03 DIAGNOSIS — R490 Dysphonia: Secondary | ICD-10-CM

## 2019-05-03 MED ORDER — MONTELUKAST SODIUM 10 MG PO TABS: 10.0000 mg | ORAL_TABLET | Freq: Every day | ORAL | 1 refills | Status: DC

## 2019-09-29 ENCOUNTER — Other Ambulatory Visit: Payer: Self-pay | Admitting: Primary Care

## 2019-09-29 DIAGNOSIS — R490 Dysphonia: Secondary | ICD-10-CM

## 2019-09-29 DIAGNOSIS — J3089 Other allergic rhinitis: Secondary | ICD-10-CM

## 2020-03-13 ENCOUNTER — Other Ambulatory Visit: Payer: Self-pay | Admitting: Primary Care

## 2020-03-13 DIAGNOSIS — J3089 Other allergic rhinitis: Secondary | ICD-10-CM

## 2020-03-13 DIAGNOSIS — R490 Dysphonia: Secondary | ICD-10-CM

## 2020-11-08 ENCOUNTER — Encounter: Payer: 59 | Admitting: Primary Care

## 2021-01-31 ENCOUNTER — Ambulatory Visit (INDEPENDENT_AMBULATORY_CARE_PROVIDER_SITE_OTHER): Payer: 59 | Admitting: Primary Care

## 2021-01-31 ENCOUNTER — Other Ambulatory Visit: Payer: Self-pay

## 2021-01-31 ENCOUNTER — Encounter: Payer: Self-pay | Admitting: Primary Care

## 2021-01-31 VITALS — BP 148/96 | HR 91 | Temp 97.6°F | Ht 63.5 in | Wt 213.0 lb

## 2021-01-31 DIAGNOSIS — R030 Elevated blood-pressure reading, without diagnosis of hypertension: Secondary | ICD-10-CM

## 2021-01-31 DIAGNOSIS — R7303 Prediabetes: Secondary | ICD-10-CM | POA: Diagnosis not present

## 2021-01-31 DIAGNOSIS — E1165 Type 2 diabetes mellitus with hyperglycemia: Secondary | ICD-10-CM | POA: Insufficient documentation

## 2021-01-31 DIAGNOSIS — Z0000 Encounter for general adult medical examination without abnormal findings: Secondary | ICD-10-CM

## 2021-01-31 DIAGNOSIS — J3089 Other allergic rhinitis: Secondary | ICD-10-CM

## 2021-01-31 DIAGNOSIS — R490 Dysphonia: Secondary | ICD-10-CM

## 2021-01-31 DIAGNOSIS — Z1231 Encounter for screening mammogram for malignant neoplasm of breast: Secondary | ICD-10-CM

## 2021-01-31 LAB — COMPREHENSIVE METABOLIC PANEL
ALT: 31 U/L (ref 0–35)
AST: 28 U/L (ref 0–37)
Albumin: 4.7 g/dL (ref 3.5–5.2)
Alkaline Phosphatase: 77 U/L (ref 39–117)
BUN: 8 mg/dL (ref 6–23)
CO2: 31 mEq/L (ref 19–32)
Calcium: 9.5 mg/dL (ref 8.4–10.5)
Chloride: 99 mEq/L (ref 96–112)
Creatinine, Ser: 0.65 mg/dL (ref 0.40–1.20)
GFR: 108.35 mL/min (ref >60.00)
Glucose, Bld: 110 mg/dL — ABNORMAL HIGH (ref 70–99)
Potassium: 4 mEq/L (ref 3.5–5.1)
Sodium: 138 mEq/L (ref 135–145)
Total Bilirubin: 0.7 mg/dL (ref 0.2–1.2)
Total Protein: 7.9 g/dL (ref 6.0–8.3)

## 2021-01-31 LAB — CBC
HCT: 40.5 % (ref 36.0–46.0)
Hemoglobin: 13.5 g/dL (ref 12.0–15.0)
MCHC: 33.3 g/dL (ref 30.0–36.0)
MCV: 89.9 fl (ref 78.0–100.0)
Platelets: 407 10*3/uL — ABNORMAL HIGH (ref 150.0–400.0)
RBC: 4.51 Mil/uL (ref 3.87–5.11)
RDW: 13.8 % (ref 11.5–15.5)
WBC: 7.3 10*3/uL (ref 4.0–10.5)

## 2021-01-31 LAB — LIPID PANEL
Cholesterol: 169 mg/dL (ref 0–200)
HDL: 28.6 mg/dL — ABNORMAL LOW (ref >39.00)
LDL Cholesterol: 128 mg/dL — ABNORMAL HIGH (ref 0–99)
NonHDL: 140.46
Total CHOL/HDL Ratio: 6
Triglycerides: 64 mg/dL (ref 0.0–149.0)
VLDL: 12.8 mg/dL (ref 0.0–40.0)

## 2021-01-31 LAB — TSH: TSH: 1.73 u[IU]/mL (ref 0.35–5.50)

## 2021-01-31 LAB — HEMOGLOBIN A1C: Hgb A1c MFr Bld: 6.6 % — ABNORMAL HIGH (ref 4.6–6.5)

## 2021-01-31 MED ORDER — OMEPRAZOLE 40 MG PO CPDR: 40.0000 mg | DELAYED_RELEASE_CAPSULE | ORAL | 0 refills | Status: DC | PRN

## 2021-01-31 NOTE — Assessment & Plan Note (Signed)
Resolved, taking omeprazole 40 mg as needed for GERD. Continue same. Refill provided.

## 2021-01-31 NOTE — Progress Notes (Signed)
Subjective:    Patient ID: Tricia Davenport, female    DOB: [birth year], 45 y.o.   MRN: [id number]  HPI  Tricia Davenport is a very pleasant 45 y.o. female who presents today for complete physical and follow up of chronic conditions.  Immunizations: -Tetanus: Unsure, around 2010, declines  -Influenza: Declines  -Covid-19: 2 vaccines   Diet: Fair diet.  Exercise: No regular exercise, walking often   Eye exam: Completes annually  Dental exam: Completes semi-annually   Pap Smear: Completed in 2020 Mammogram: Completed in 2020  BP Readings from Last 3 Encounters:  01/31/21 (!) 168/92  01/31/19 140/84  09/04/17 140/80   Wt Readings from Last 3 Encounters:  01/31/21 213 lb (96.6 kg)  01/31/19 213 lb 8 oz (96.8 kg)  09/04/17 208 lb 8 oz (94.6 kg)       Review of Systems  Constitutional:  Negative for unexpected weight change.  HENT:  Negative for rhinorrhea.   Eyes:  Negative for visual disturbance.  Respiratory:  Negative for shortness of breath.   Cardiovascular:  Negative for chest pain.  Gastrointestinal:  Negative for constipation and diarrhea.  Genitourinary:  Negative for difficulty urinating and menstrual problem.  Musculoskeletal:  Negative for arthralgias and myalgias.  Skin:  Negative for rash.  Allergic/Immunologic: Positive for environmental allergies.  Neurological:  Negative for dizziness and headaches.  Psychiatric/Behavioral:  The patient is not nervous/anxious.         Past Medical History:  Diagnosis Date   Environmental and seasonal allergies     Social History   Socioeconomic History   Marital status: Married    Spouse name: Not on file   Number of children: Not on file   Years of education: Not on file   Highest education level: Not on file  Occupational History   Not on file  Tobacco Use   Smoking status: Never   Smokeless tobacco: Never  Substance and Sexual Activity   Alcohol use: Yes    Alcohol/week: 1.0 standard  drink    Types: 1 Glasses of wine per week    Comment: social   Drug use: No   Sexual activity: Yes    Birth control/protection: Condom  Other Topics Concern   Not on file  Social History Narrative   Works for [employer].   Single, no children.   [name]'s sister in law.   Social Determinants of Health   Financial Resource Strain: Not on file  Food Insecurity: Not on file  Transportation Needs: Not on file  Physical Activity: Not on file  Stress: Not on file  Social Connections: Not on file  Intimate Partner Violence: Not on file    History reviewed. No pertinent surgical history.  Family History  Problem Relation Age of Onset   Diabetes Mother    Hypertension Mother    Hypertension Father     No Known Allergies  Current Outpatient Medications on File Prior to Visit  Medication Sig Dispense Refill   montelukast (SINGULAIR) 10 MG tablet TAKE 1 TABLET BY MOUTH AT  BEDTIME 90 tablet 3   omeprazole (PRILOSEC) 40 MG capsule Take 40 mg by mouth as needed.     No current facility-administered medications on file prior to visit.    BP (!) 168/92   Pulse 91   Temp 97.6 F (36.4 C) (Temporal)   Ht 5' 3.5" (1.613 m)   Wt 213 lb (96.6 kg)   SpO2 96%   BMI 37.14 kg/m  Objective:   Physical Exam HENT:     Right Ear: Tympanic membrane and ear canal normal.     Left Ear: Tympanic membrane and ear canal normal.     Nose: Nose normal.  Eyes:     Conjunctiva/sclera: Conjunctivae normal.     Pupils: Pupils are equal, round, and reactive to light.  Neck:     Thyroid: No thyromegaly.  Cardiovascular:     Rate and Rhythm: Normal rate and regular rhythm.     Heart sounds: No murmur heard. Pulmonary:     Effort: Pulmonary effort is normal.     Breath sounds: Normal breath sounds. No rales.  Abdominal:     General: Bowel sounds are normal.     Palpations: Abdomen is soft.     Tenderness: There is no abdominal tenderness.  Musculoskeletal:        General:  Normal range of motion.     Cervical back: Neck supple.  Lymphadenopathy:     Cervical: No cervical adenopathy.  Skin:    General: Skin is warm and dry.     Findings: No rash.  Neurological:     Mental Status: She is alert and oriented to person, place, and time.     Cranial Nerves: No cranial nerve deficit.     Deep Tendon Reflexes: Reflexes are normal and symmetric.  Psychiatric:        Mood and Affect: Mood normal.          Assessment & Plan:      This visit occurred during the SARS-CoV-2 public health emergency.  Safety protocols were in place, including screening questions prior to the visit, additional usage of staff PPE, and extensive cleaning of exam room while observing appropriate contact time as indicated for disinfecting solutions.

## 2021-01-31 NOTE — Assessment & Plan Note (Signed)
Declines tetanus and influenza vaccines. Pap smear UTD. Mammogram due, orders placed.  Discussed the importance of a healthy diet and regular exercise in order for weight loss, and to reduce the risk of further co-morbidity.  Exam today stable. Labs pending.

## 2021-01-31 NOTE — Assessment & Plan Note (Signed)
Doing well on montelukast 10 mg daily, uses year round.  Continue same.

## 2021-01-31 NOTE — Assessment & Plan Note (Addendum)
Above goal in the office today, better on recheck but still above goal.   Family history of hypertension.   She will start checking BP at home and report if readings are are consistently at or above 130/90.  She will work on weight loss.

## 2021-01-31 NOTE — Assessment & Plan Note (Signed)
A1C of 6.4 two years ago, never followed up for repeat labs.   Repeat A1C pending.  Consider Ozempic or Trulicity.

## 2021-01-31 NOTE — Patient Instructions (Addendum)
Stop by the lab prior to leaving today. I will notify you of your results once received.   [facility] to [facility] your [facility].   Your blood pressure should consistently run less than 130 on top and 90 on bottom. Watch your blood pressure, notify me if you consistently see readings at or above those numbers.  We can consider Ozempic or Trulicity for weight loss.   It was a pleasure to see you today!  Preventive Care 19-43 Years Old, Female Preventive care refers to lifestyle choices and visits with your health care provider that can promote health and wellness. This includes: A yearly physical exam. This is also called an annual wellness visit. Regular dental and eye exams. Immunizations. Screening for certain conditions. Healthy lifestyle choices, such as: Eating a healthy diet. Getting regular exercise. Not using drugs or products that contain nicotine and tobacco. Limiting alcohol use. What can I expect for my preventive care visit? Physical exam Your health care provider will check your: Height and weight. These may be used to calculate your BMI (body mass index). BMI is a measurement that tells if you are at a healthy weight. Heart rate and blood pressure. Body temperature. Skin for abnormal spots. Counseling Your health care provider may ask you questions about your: Past medical problems. Family's medical history. Alcohol, tobacco, and drug use. Emotional well-being. Home life and relationship well-being. Sexual activity. Diet, exercise, and sleep habits. Work and work Statistician. Access to firearms. Method of birth control. Menstrual cycle. Pregnancy history. What immunizations do I need? Vaccines are usually given at various ages, according to a schedule. Your health care provider will recommend vaccines for you based on your age, medical history, and lifestyle or other factors, such as travel or where you work. What tests do I need? Blood  tests Lipid and cholesterol levels. These may be checked every 5 years, or more often if you are over 79 years old. Hepatitis C test. Hepatitis B test. Screening Lung cancer screening. You may have this screening every year starting at age 70 if you have a 30-pack-year history of smoking and currently smoke or have quit within the past 15 years. Colorectal cancer screening. All adults should have this screening starting at age 29 and continuing until age 63. Your health care provider may recommend screening at age 34 if you are at increased risk. You will have tests every 1-10 years, depending on your results and the type of screening test. Diabetes screening. This is done by checking your blood sugar (glucose) after you have not eaten for a while (fasting). You may have this done every 1-3 years. Mammogram. This may be done every 1-2 years. Talk with your health care provider about when you should start having regular mammograms. This may depend on whether you have a family history of breast cancer. BRCA-related cancer screening. This may be done if you have a family history of breast, ovarian, tubal, or peritoneal cancers. Pelvic exam and Pap test. This may be done every 3 years starting at age 42. Starting at age 66, this may be done every 5 years if you have a Pap test in combination with an HPV test. Other tests STD (sexually transmitted disease) testing, if you are at risk. Bone density scan. This is done to screen for osteoporosis. You may have this scan if you are at high risk for osteoporosis. Talk with your health care provider about your test results, treatment options, and if necessary, the need for  more tests. Follow these instructions at home: Eating and drinking  Eat a diet that includes fresh fruits and vegetables, whole grains, lean protein, and low-fat dairy products. Take vitamin and mineral supplements as recommended by your health care provider. Do not drink alcohol  if: Your health care provider tells you not to drink. You are pregnant, may be pregnant, or are planning to become pregnant. If you drink alcohol: Limit how much you have to 0-1 drink a day. Be aware of how much alcohol is in your drink. In the [place] one drink equals one 12 oz bottle of beer (355 mL), one 5 oz glass of wine (148 mL), or one 1 oz glass of hard liquor (44 mL). Lifestyle Take daily care of your teeth and gums. Brush your teeth every morning and night with fluoride toothpaste. Floss one time each day. Stay active. Exercise for at least 30 minutes 5 or more days each week. Do not use any products that contain nicotine or tobacco, such as cigarettes, e-cigarettes, and chewing tobacco. If you need help quitting, ask your health care provider. Do not use drugs. If you are sexually active, practice safe sex. Use a condom or other form of protection to prevent STIs (sexually transmitted infections). If you do not wish to become pregnant, use a form of birth control. If you plan to become pregnant, see your health care provider for a prepregnancy visit. If told by your health care provider, take low-dose aspirin daily starting at age 29. Find healthy ways to cope with stress, such as: Meditation, yoga, or listening to music. Journaling. Talking to a trusted person. Spending time with friends and family. Safety Always wear your seat belt while driving or riding in a vehicle. Do not drive: If you have been drinking alcohol. Do not ride with someone who has been drinking. When you are tired or distracted. While texting. Wear a helmet and other protective equipment during sports activities. If you have firearms in your house, make sure you follow all gun safety procedures. What's next? Visit your health care provider once a year for an annual wellness visit. Ask your health care provider how often you should have your eyes and teeth checked. Stay up to date on all vaccines. This  information is not intended to replace advice given to you by your health care provider. Make sure you discuss any questions you have with your health care provider. Document Revised: 07/06/2020 Document Reviewed: 01/07/2018 Elsevier Patient Education  2022 [employer].

## 2021-02-14 ENCOUNTER — Other Ambulatory Visit: Payer: Self-pay | Admitting: Primary Care

## 2021-02-14 DIAGNOSIS — R490 Dysphonia: Secondary | ICD-10-CM

## 2021-02-14 DIAGNOSIS — J3089 Other allergic rhinitis: Secondary | ICD-10-CM

## 2021-05-28 ENCOUNTER — Other Ambulatory Visit: Payer: Self-pay | Admitting: Primary Care

## 2021-05-28 DIAGNOSIS — R490 Dysphonia: Secondary | ICD-10-CM

## 2021-07-29 IMAGING — US US BREAST*R* LIMITED INC AXILLA
1 series · 6 of 6 positions shown · non-contrast
Comparison: Baseline mammography 04/15/2019.

CLINICAL DATA: Recall from baseline screening mammography with
tomosynthesis, possible focal asymmetry involving the UPPER OUTER
RIGHT breast at POSTERIOR depth.

EXAM:
DIGITAL DIAGNOSTIC RIGHT MAMMOGRAM WITH TOMO
ULTRASOUND RIGHT BREAST

[Series 1: us breast*right* limited inc axilla · 0.07mm/px · 6 of 6 slices shown]
[im 1/6]
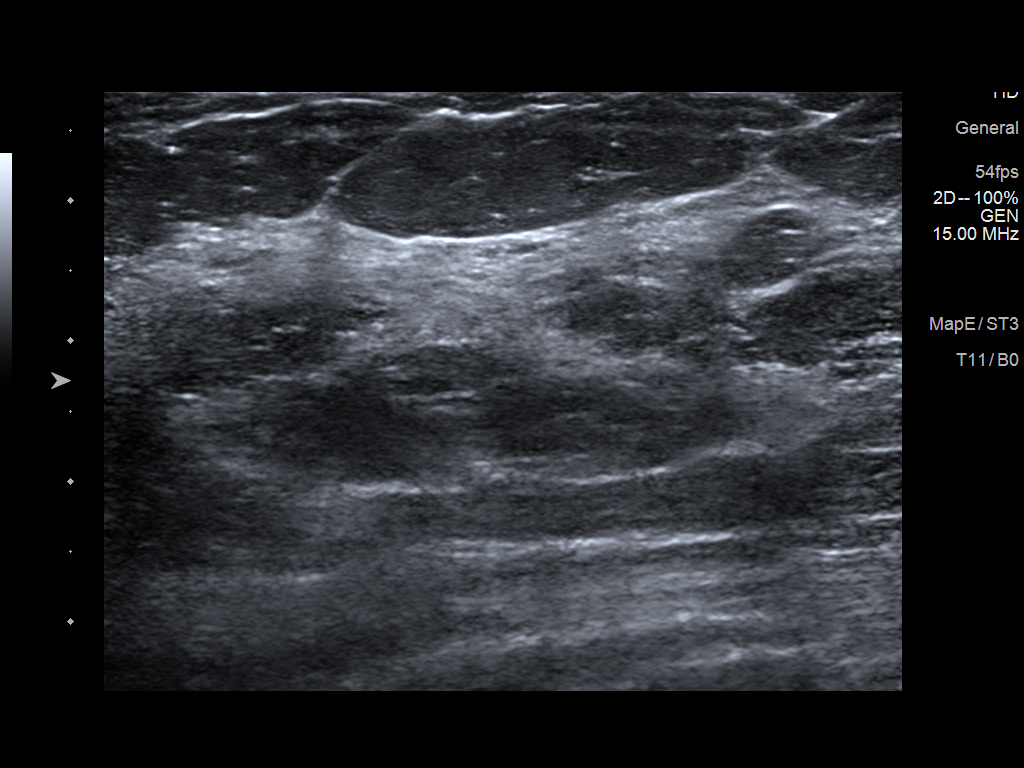
[im 2/6]
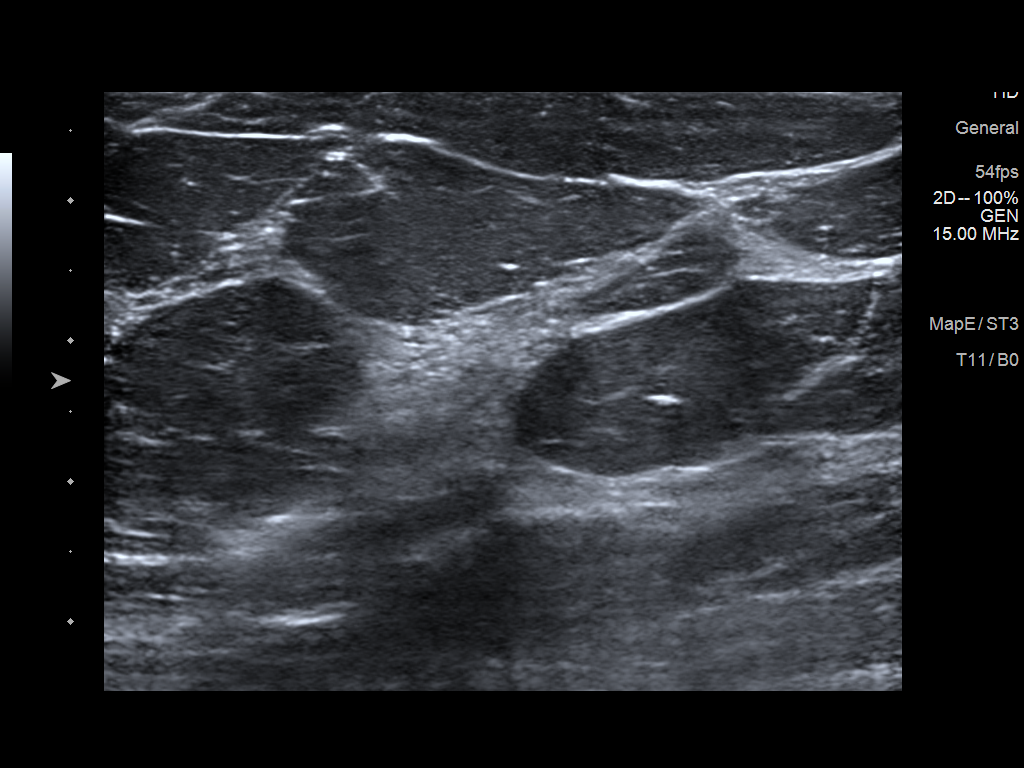
[im 3/6]
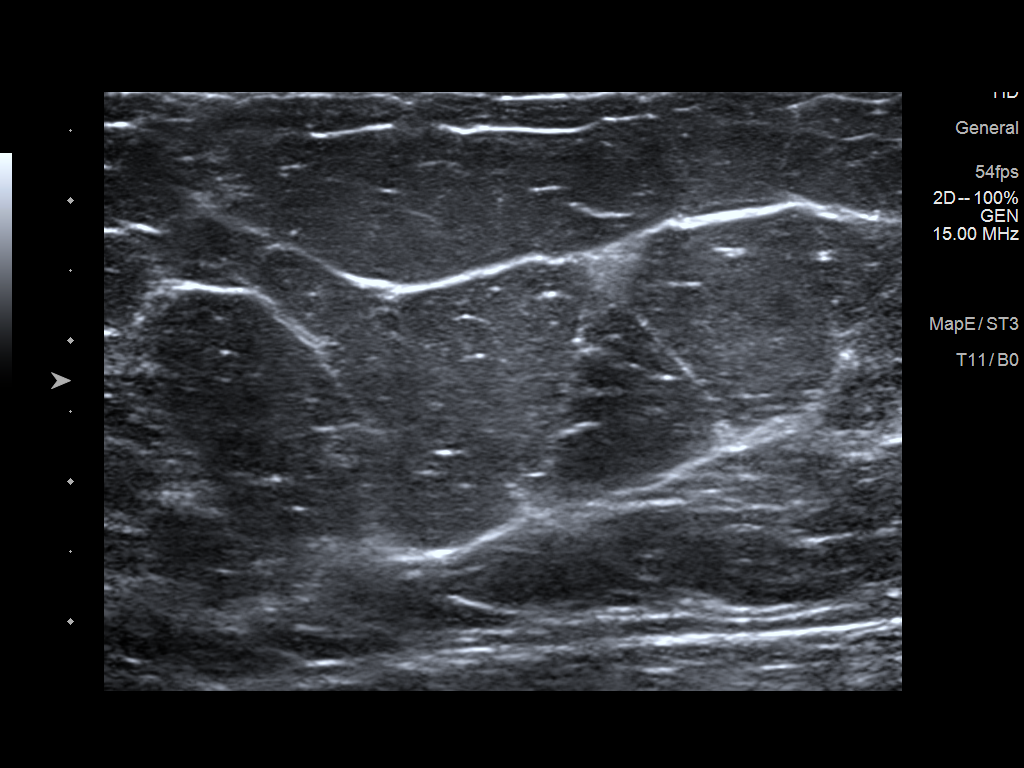
[im 4/6]
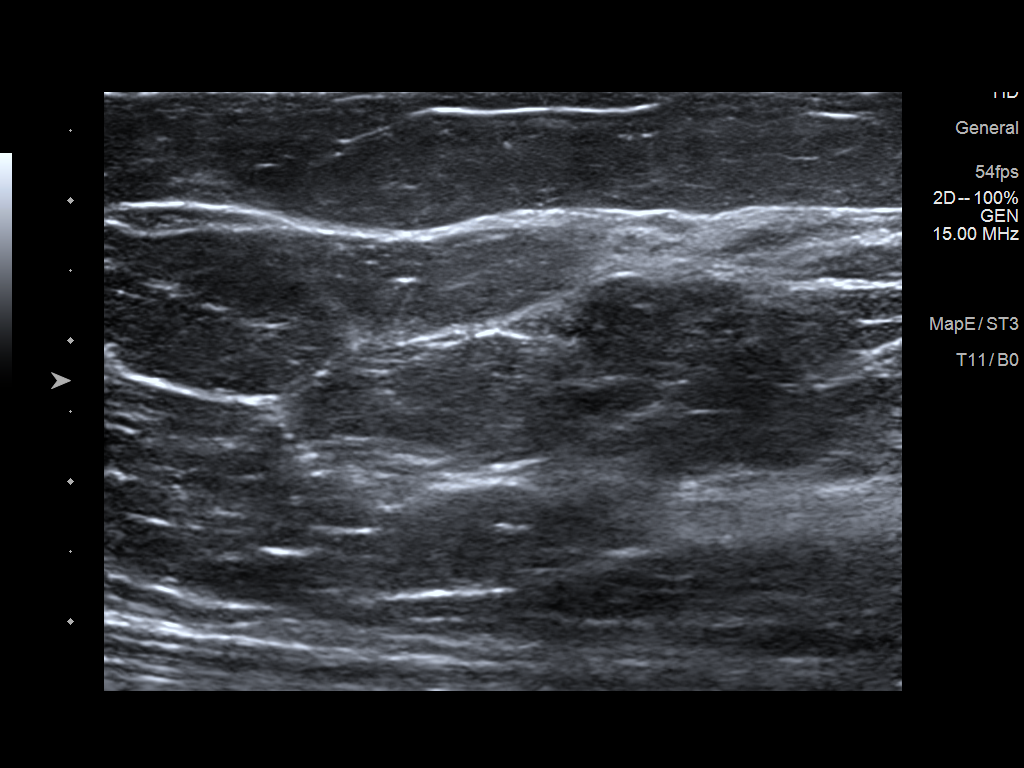
[im 5/6]
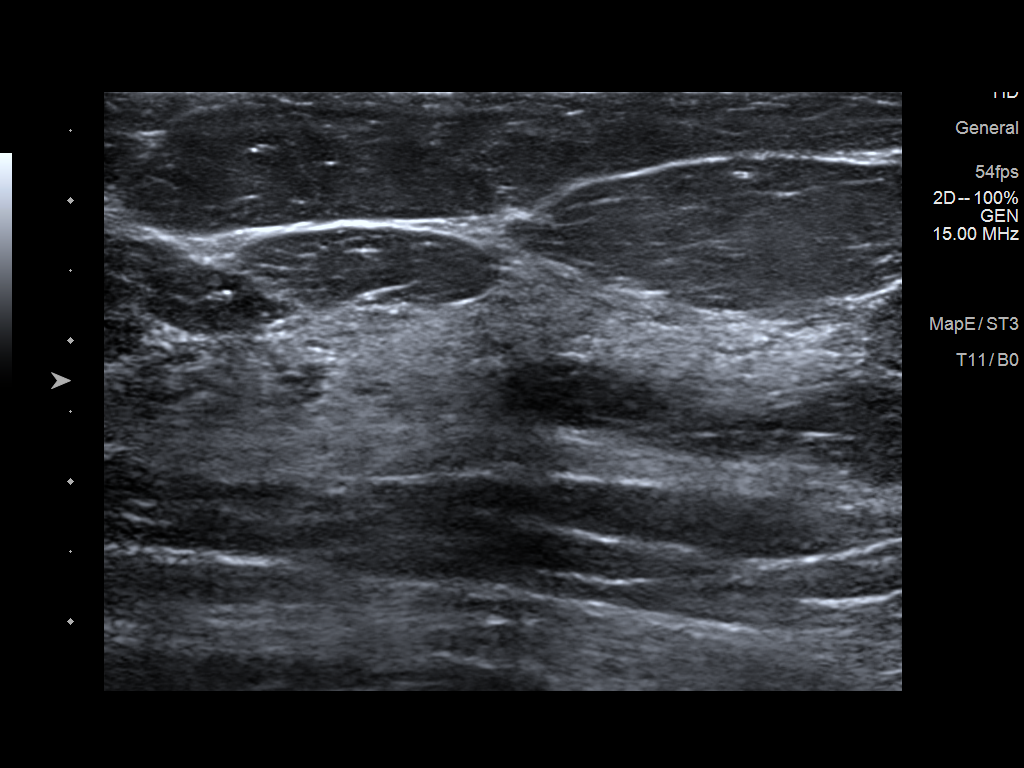
[im 6/6]
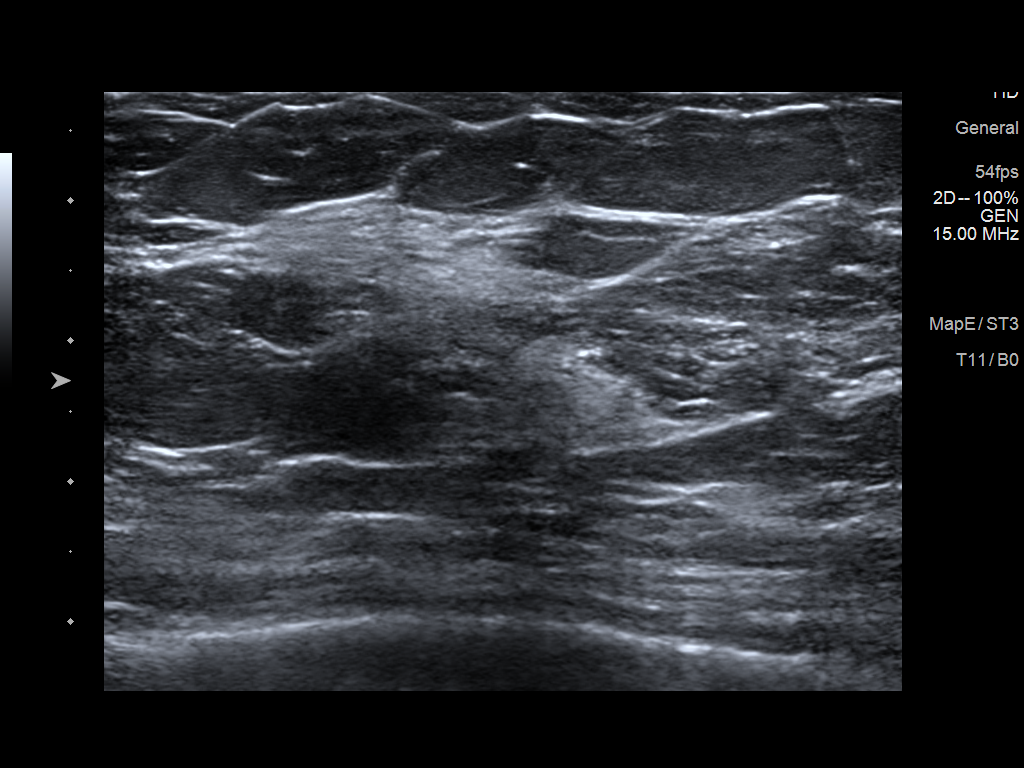

[6 of 6 positions shown; findings below may reference images not displayed]

No prior ultrasound.

ACR Breast Density Category c: The breast tissue is heterogeneously
dense, which may obscure small masses.
FINDINGS: Tomosynthesis and synthesized spot-compression CC and MLO views of
the area of concern in the RIGHT breast were obtained.

Spot compression images demonstrate focally dense fibroglandular
tissue in the UPPER OUTER QUADRANT at POSTERIOR depth in the area of
concern. I do not identify a discrete mass and there is no evidence
of architectural distortion.

Targeted RIGHT breast ultrasound is performed, showing normal dense
fibroglandular tissue in the UPPER OUTER QUADRANT. No cyst, solid
mass or abnormal acoustic shadowing is identified.
IMPRESSION: 1. No mammographic or sonographic evidence of malignancy involving
the RIGHT breast.
2. The focal asymmetry questioned on screening mammography
corresponds to focally dense fibroglandular tissue.

RECOMMENDATION:
Screening mammogram in one year.(Code:N7-D-GRV)

I have discussed the findings and recommendations with the patient.
If applicable, a reminder letter will be sent to the patient
regarding the next appointment.

BI-RADS CATEGORY  1: Negative.

## 2021-07-29 IMAGING — MG MM DIGITAL DIAGNOSTIC UNILAT*R* W/ TOMO W/ CAD
4 series · 4 of 12 positions shown · non-contrast
Comparison: Baseline mammography 04/15/2019.

CLINICAL DATA: Recall from baseline screening mammography with
tomosynthesis, possible focal asymmetry involving the UPPER OUTER
RIGHT breast at POSTERIOR depth.

EXAM:
DIGITAL DIAGNOSTIC RIGHT MAMMOGRAM WITH TOMO
ULTRASOUND RIGHT BREAST

[R MLO synth-2D]
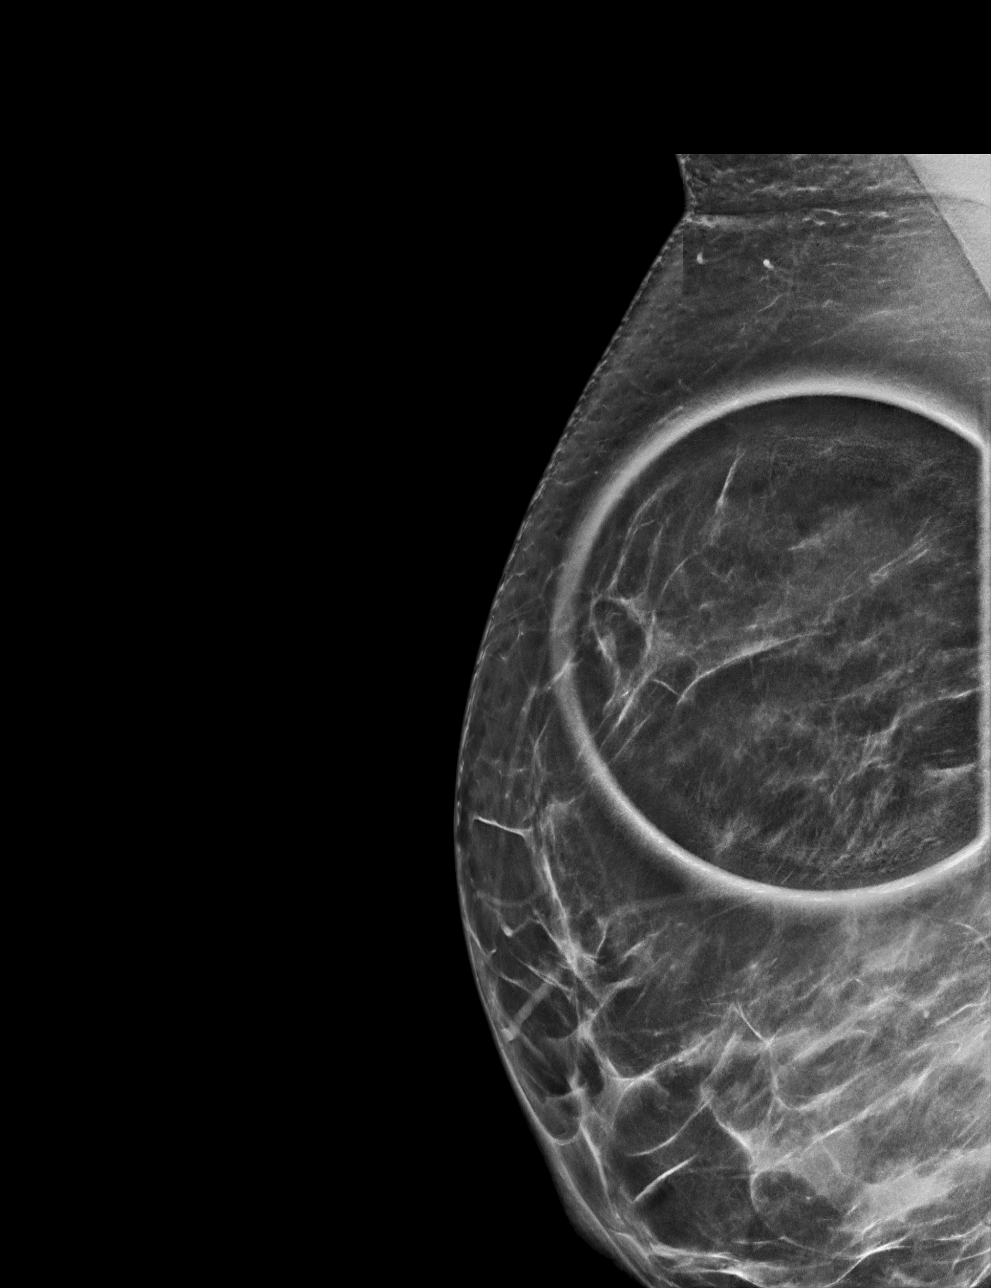

[R CC synth-2D]
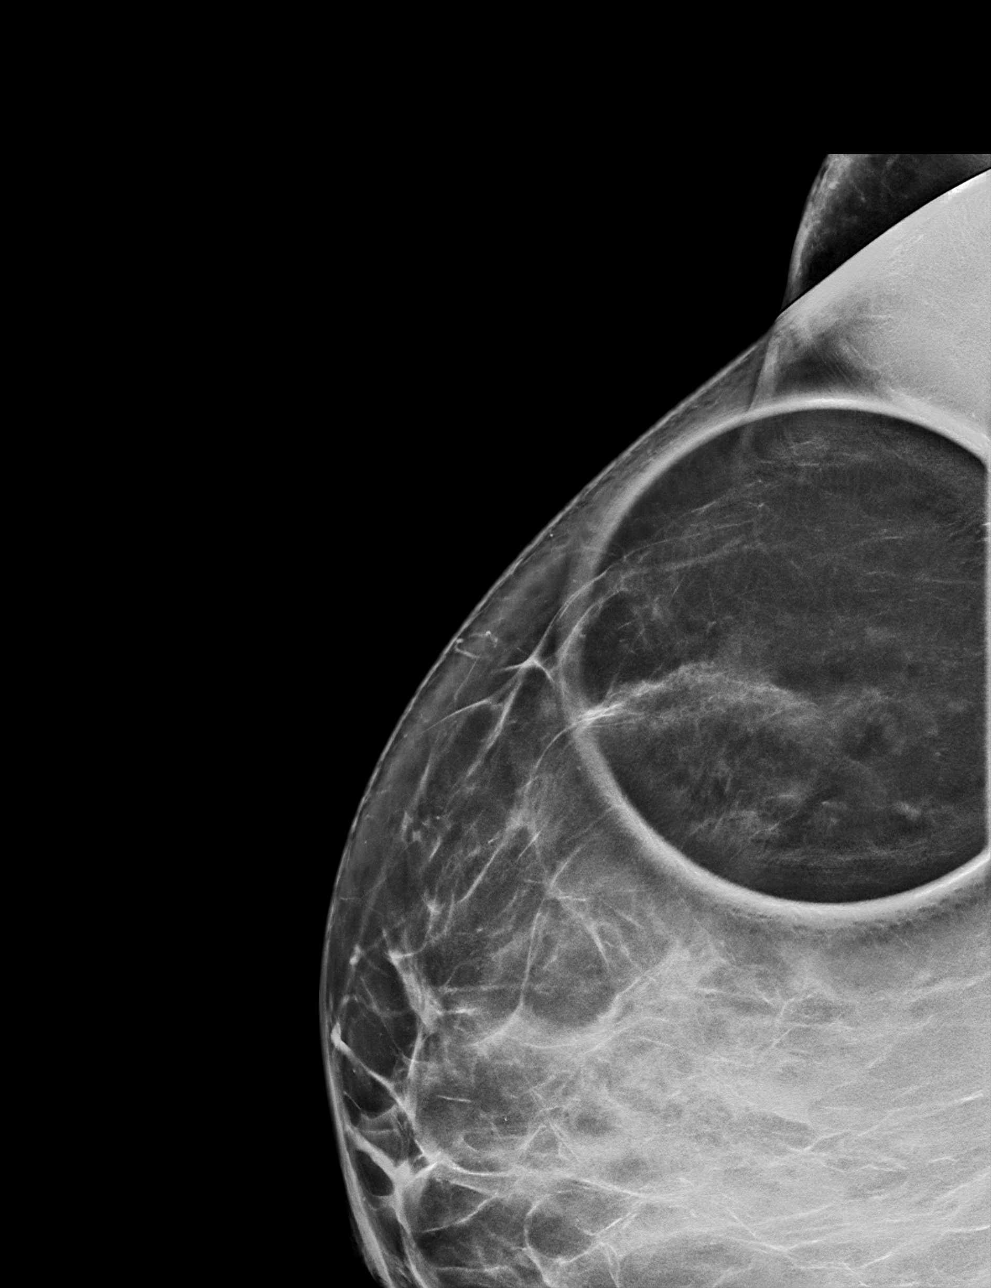

[R MLO tomo · tomo slice 43/85.0]
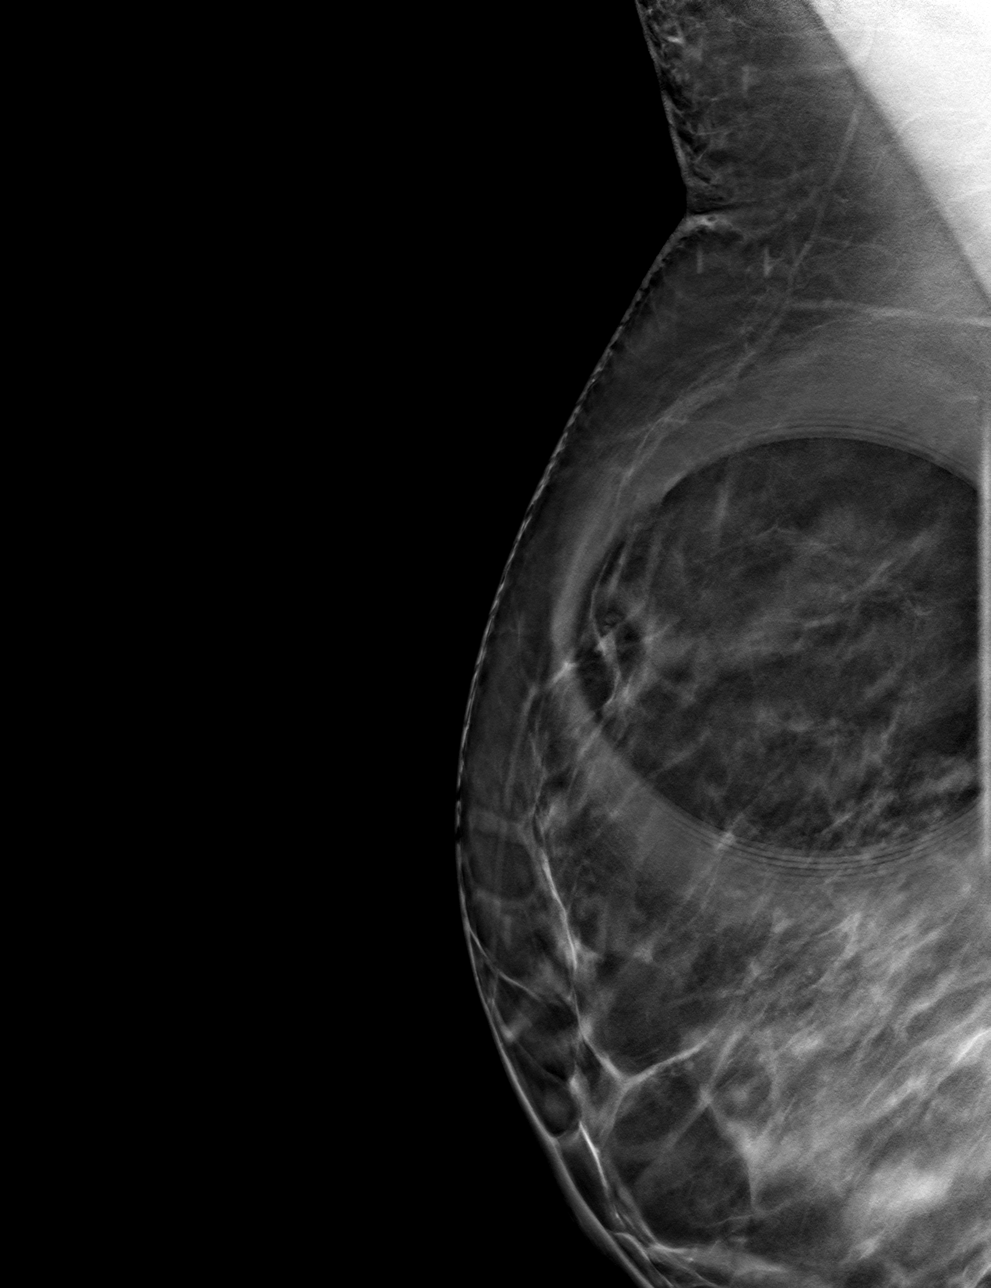

[R CC tomo · tomo slice 43/84.0]
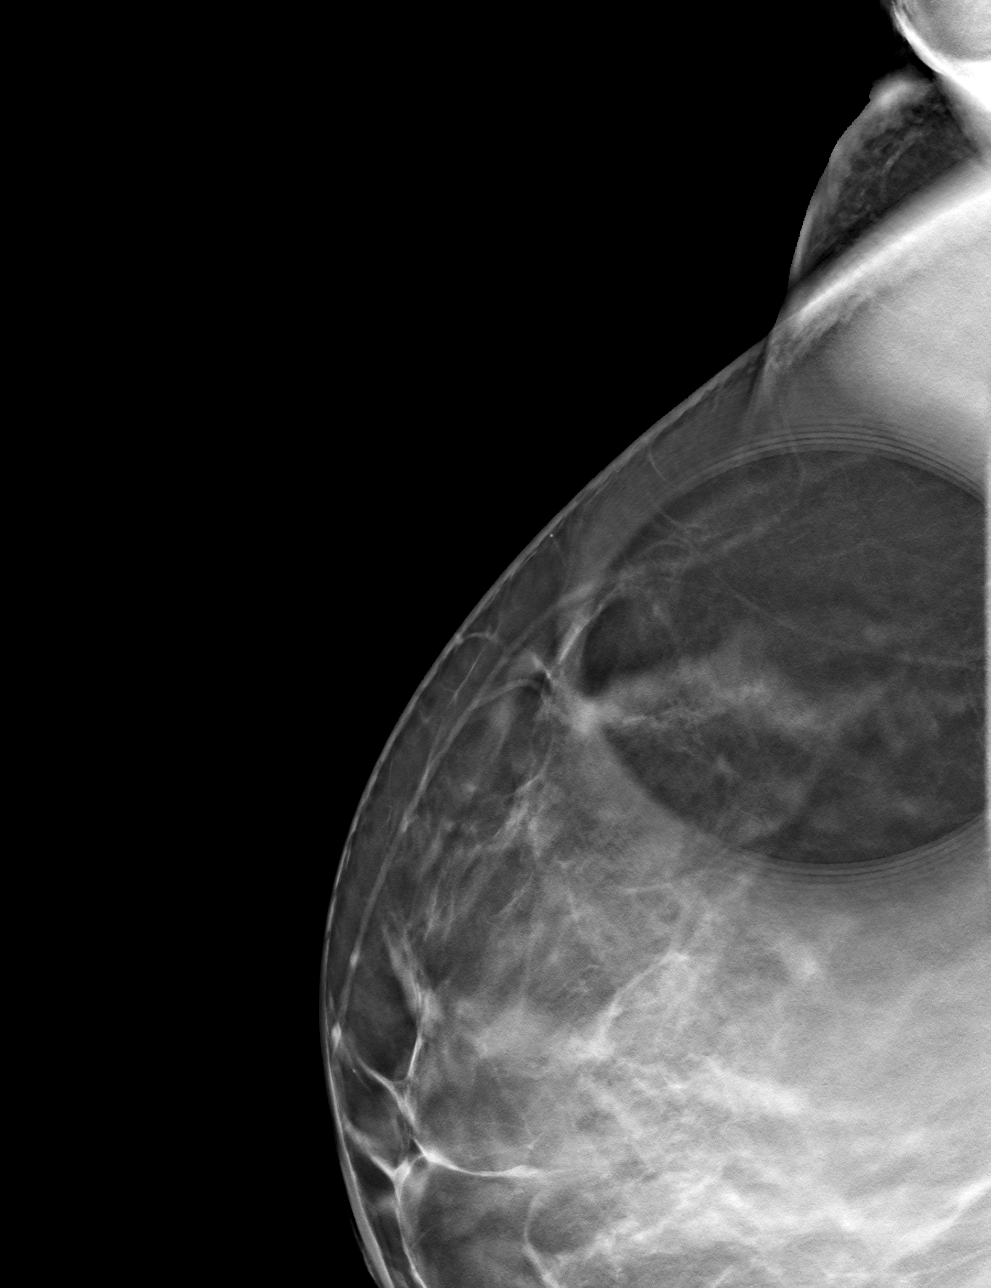

[4 of 12 positions shown; findings below may reference images not displayed]

No prior ultrasound.

ACR Breast Density Category c: The breast tissue is heterogeneously
dense, which may obscure small masses.
FINDINGS: Tomosynthesis and synthesized spot-compression CC and MLO views of
the area of concern in the RIGHT breast were obtained.

Spot compression images demonstrate focally dense fibroglandular
tissue in the UPPER OUTER QUADRANT at POSTERIOR depth in the area of
concern. I do not identify a discrete mass and there is no evidence
of architectural distortion.

Targeted RIGHT breast ultrasound is performed, showing normal dense
fibroglandular tissue in the UPPER OUTER QUADRANT. No cyst, solid
mass or abnormal acoustic shadowing is identified.
IMPRESSION: 1. No mammographic or sonographic evidence of malignancy involving
the RIGHT breast.
2. The focal asymmetry questioned on screening mammography
corresponds to focally dense fibroglandular tissue.

RECOMMENDATION:
Screening mammogram in one year.(Code:N7-D-GRV)

I have discussed the findings and recommendations with the patient.
If applicable, a reminder letter will be sent to the patient
regarding the next appointment.

BI-RADS CATEGORY  1: Negative.

## 2021-08-23 ENCOUNTER — Encounter: Payer: Self-pay | Admitting: Primary Care

## 2021-08-23 ENCOUNTER — Telehealth: Payer: Self-pay | Admitting: *Deleted

## 2021-08-23 ENCOUNTER — Ambulatory Visit (INDEPENDENT_AMBULATORY_CARE_PROVIDER_SITE_OTHER): Payer: 59 | Admitting: Primary Care

## 2021-08-23 VITALS — BP 160/100 | HR 114 | Temp 98.6°F | Ht 63.5 in | Wt 220.0 lb

## 2021-08-23 DIAGNOSIS — I10 Essential (primary) hypertension: Secondary | ICD-10-CM

## 2021-08-23 DIAGNOSIS — E1165 Type 2 diabetes mellitus with hyperglycemia: Secondary | ICD-10-CM | POA: Diagnosis not present

## 2021-08-23 LAB — POCT GLYCOSYLATED HEMOGLOBIN (HGB A1C): Hemoglobin A1C: 6.4 % — AB (ref 4.0–5.6)

## 2021-08-23 MED ORDER — OLMESARTAN MEDOXOMIL 20 MG PO TABS: 20.0000 mg | ORAL_TABLET | Freq: Every day | ORAL | 0 refills | Status: DC

## 2021-08-23 MED ORDER — BLOOD GLUCOSE MONITOR KIT: PACK | 0 refills | Status: AC

## 2021-08-23 NOTE — Telephone Encounter (Signed)
Pt called back she just saw PCP today and wasn't sure if she was sending in a DM meter. Pt said they discussed her getting a meter but nothing was sent to pharmacy. Pt wanted to check to see if PCP was still going to send in the meter and supplies. ? ?[facility] [address]. ?

## 2021-08-23 NOTE — Assessment & Plan Note (Addendum)
Uncontrolled for years.  ? ?She agrees for treatment. ? ?Rx for olmesartan 20 mg daily.  ?We will plan to see her back in 2-3 weeks for BP check and BMP. ?

## 2021-08-23 NOTE — Patient Instructions (Signed)
Start olmesartan 20 mg for blood pressure.  Take 1 tablet by mouth once daily. ? ?It is important that you improve your diet. Please limit carbohydrates in the form of white bread, rice, pasta, sweets, fast food, fried food, sugary drinks, etc. Increase your consumption of fresh fruits and vegetables, whole grains, lean protein. ? ?Ensure you are consuming 64 ounces of water daily. ? ?Please schedule a follow up visit to meet back with me in 2-3 weeks for blood pressure check.  ? ?It was a pleasure to see you today! ? ?

## 2021-08-23 NOTE — Assessment & Plan Note (Addendum)
New diagnosis as of September 2022, she declined treatment at the time.  ? ?A1C today of 6.4 which is an improvement form 6.6.  Long discussion today about pathophysiology of diabetes and the importance of lifestyle changes to prevent complications. ? ?She will work hard on lifestyle changes.  Follow-up in 3 months. ? ? ?

## 2021-08-23 NOTE — Progress Notes (Signed)
? ?Subjective:  ? ? Patient ID: Tricia Davenport, female    DOB: [birth year], 46 y.o.   MRN: [id number] ? ?HPI ? ?Tricia Davenport is a very pleasant 46 y.o. female with a history of elevated blood pressure readings, prediabetes who presents today to discuss elevated blood pressure readings and diabetes.  ? ?1) Essential Hypertension:  ? ?She was last evaluated in September 2022 for her annual physical, blood pressure was noted to be elevated during this visit and also during a visit in 2020. ? ?Given these elevated readings she was asked to monitor her blood pressure readings at home and notify if they remain elevated. ? ?Since her last visit she has had her BP checked at a few of her other provider offices which has been running 160's/100's.  ? ?She does notice headaches, always attributed this to allergies. She denies chest pain, dizziness. She has a family history of hypertension in her mother.  ? ? ? ?Wt Readings from Last 3 Encounters:  ?08/23/21 220 lb (99.8 kg)  ?01/31/21 213 lb (96.6 kg)  ?01/31/19 213 lb 8 oz (96.8 kg)  ? ? ? ?BP Readings from Last 3 Encounters:  ?08/23/21 (!) 160/100  ?01/31/21 (!) 148/96  ?01/31/19 140/84  ? ?2) Type 2 Diabetes: New diagnosis as of September 2022. She denies polyphagia, numbness to feet, visual changes. She does notice polydipsia and polyuria.  ? ?Current medications include: None. ? ?Last A1C: 6.6 in September 2022,  ?Last Eye Exam: Scheduled for next week ?Last Foot Exam: Due ?Pneumonia Vaccination: ?Urine Microalbumin: Due ?Statin: None ? ?Dietary changes since last visit: Unhealthy diet.  ? ? ?Exercise: None ? ? ? ? ? ?Review of Systems  ?Constitutional:  Negative for unexpected weight change.  ?Respiratory:  Negative for shortness of breath.   ?Cardiovascular:  Negative for chest pain.  ?Endocrine: Positive for polydipsia and polyuria. Negative for polyphagia.  ?Genitourinary:  Negative for difficulty urinating.  ?Skin:  Negative for rash.   ?Allergic/Immunologic: Positive for environmental allergies.  ?Neurological:  Positive for headaches. Negative for dizziness and numbness.  ? ?   ? ? ?Past Medical History:  ?Diagnosis Date  ? Environmental and seasonal allergies   ? ? ?Social History  ? ?Socioeconomic History  ? Marital status: Married  ?  Spouse name: Not on file  ? Number of children: Not on file  ? Years of education: Not on file  ? Highest education level: Not on file  ?Occupational History  ? Not on file  ?Tobacco Use  ? Smoking status: Never  ? Smokeless tobacco: Never  ?Substance and Sexual Activity  ? Alcohol use: Yes  ?  Alcohol/week: 1.0 standard drink  ?  Types: 1 Glasses of wine per week  ?  Comment: social  ? Drug use: No  ? Sexual activity: Yes  ?  Birth control/protection: Condom  ?Other Topics Concern  ? Not on file  ?Social History Narrative  ? Works for [employer].  ? Single, no children.  ? [name]'s sister in law.  ? ?Social Determinants of Health  ? ?Financial Resource Strain: Not on file  ?Food Insecurity: Not on file  ?Transportation Needs: Not on file  ?Physical Activity: Not on file  ?Stress: Not on file  ?Social Connections: Not on file  ?Intimate Partner Violence: Not on file  ? ? ?History reviewed. No pertinent surgical history. ? ?Family History  ?Problem Relation Age of Onset  ? Diabetes Mother   ? Hypertension Mother   ?  Hypertension Father   ? ? ?No Known Allergies ? ?Current Outpatient Medications on File Prior to Visit  ?Medication Sig Dispense Refill  ? montelukast (SINGULAIR) 10 MG tablet Take 1 tablet (10 mg total) by mouth at bedtime. For allergies. 90 tablet 3  ? omeprazole (PRILOSEC) 40 MG capsule TAKE 1 CAPSULE BY MOUTH AS NEEDED FOR HEARTBURN 90 capsule 0  ? ?No current facility-administered medications on file prior to visit.  ? ? ?BP (!) 160/100   Pulse (!) 114   Temp 98.6 ?F (37 ?C) (Oral)   Ht 5' 3.5" (1.613 m)   Wt 220 lb (99.8 kg)   SpO2 97%   BMI 38.36 kg/m?  ?Objective:  ?  Physical Exam ?Cardiovascular:  ?   Rate and Rhythm: Normal rate and regular rhythm.  ?Pulmonary:  ?   Effort: Pulmonary effort is normal.  ?   Breath sounds: Normal breath sounds.  ?Musculoskeletal:  ?   Cervical back: Neck supple.  ?Skin: ?   General: Skin is warm and dry.  ?Neurological:  ?   Mental Status: She is alert.  ? ? ? ? ? ?   ?Assessment & Plan:  ? ? ? ? ?This visit occurred during the SARS-CoV-2 public health emergency.  Safety protocols were in place, including screening questions prior to the visit, additional usage of staff PPE, and extensive cleaning of exam room while observing appropriate contact time as indicated for disinfecting solutions.  ?

## 2021-08-23 NOTE — Telephone Encounter (Signed)
Called patient reviewed all information and repeated back to me. Will call if any questions.  ?I have faxed prescription as requested.  ?

## 2021-08-23 NOTE — Telephone Encounter (Signed)
Yes, my apologies. ? ?Please fax glucometer kit to pharmacy. ? ?Discussed with patient best time to check glucose which are before any meal, 2 hours after any meal, or at bedtime. ? ?Her blood sugars should run consistently below 130. ? ?Rx printed and placed in Joellen's inbox. ?

## 2021-09-13 ENCOUNTER — Ambulatory Visit: Payer: 59 | Admitting: Primary Care

## 2021-09-15 ENCOUNTER — Other Ambulatory Visit: Payer: Self-pay | Admitting: Primary Care

## 2021-09-15 DIAGNOSIS — I10 Essential (primary) hypertension: Secondary | ICD-10-CM

## 2021-09-15 NOTE — Telephone Encounter (Signed)
Patient was supposed to schedule BP follow up with me and has not. ?Needs to be seen ASAP. Please schedule.  ? ?Let me know when this has been done. ?

## 2021-09-16 MED ORDER — OLMESARTAN MEDOXOMIL 20 MG PO TABS: 20.0000 mg | ORAL_TABLET | Freq: Every day | ORAL | 0 refills | Status: DC

## 2021-09-16 NOTE — Telephone Encounter (Signed)
Noted. Refill(s) sent to pharmacy.  

## 2021-09-16 NOTE — Telephone Encounter (Signed)
Called patient father passed away this week will not be able to come in until next week. I have set her up on 5/19.  ?

## 2021-09-27 ENCOUNTER — Ambulatory Visit (INDEPENDENT_AMBULATORY_CARE_PROVIDER_SITE_OTHER): Payer: 59 | Admitting: Primary Care

## 2021-09-27 ENCOUNTER — Encounter: Payer: Self-pay | Admitting: Primary Care

## 2021-09-27 VITALS — BP 146/88 | HR 76 | Temp 98.7°F | Ht 63.5 in | Wt 216.0 lb

## 2021-09-27 DIAGNOSIS — I10 Essential (primary) hypertension: Secondary | ICD-10-CM | POA: Diagnosis not present

## 2021-09-27 MED ORDER — HYDROCHLOROTHIAZIDE 12.5 MG PO TABS: 12.5000 mg | ORAL_TABLET | Freq: Every day | ORAL | 0 refills | Status: DC

## 2021-09-27 NOTE — Progress Notes (Signed)
Subjective:    Patient ID: Tricia Davenport, female    DOB: 23-Apr-1976, 46 y.o.   MRN: [id number]  HPI  Tricia Davenport is a very pleasant 46 y.o. female with a history of hypertension, type 2 diabetes who presents today for follow up of hypertension.  She was last evaluated in April 2023 for hypertension, BP readings in other provider's offices was noted to be in the 160's/100's. Also with headaches. During this visit we initiated treatment with olmesartan 20 mg daily. She is here today for follow up.  Since her last visit she is compliant to her olmesartan 20 mg daily. She is not checking her BP at home. Overall she's noticed feeling slightly better overall, headaches are less frequent.   She has noticed an itchy rash to her bilateral upper extremities which began a few days ago. She picked up a refill of her olmesartan and noticed the manufactuer had changed. She took a full bottle of olmesartan without a rash last month. She denies throat tightness, wheezing, shortness of breath, changes in soaps/detergents/other meds.  BP Readings from Last 3 Encounters:  09/27/21 (!) 146/88  08/23/21 (!) 160/100  01/31/21 (!) 148/96      Review of Systems  Respiratory:  Negative for shortness of breath.   Cardiovascular:  Negative for chest pain.  Skin:  Positive for rash.  Neurological:  Positive for headaches.        Past Medical History:  Diagnosis Date   Environmental and seasonal allergies     Social History   Socioeconomic History   Marital status: Married    Spouse name: Not on file   Number of children: Not on file   Years of education: Not on file   Highest education level: Not on file  Occupational History   Not on file  Tobacco Use   Smoking status: Never   Smokeless tobacco: Never  Substance and Sexual Activity   Alcohol use: Yes    Alcohol/week: 1.0 standard drink    Types: 1 Glasses of wine per week    Comment: social   Drug use: No   Sexual  activity: Yes    Birth control/protection: Condom  Other Topics Concern   Not on file  Social History Narrative   Works for [employer].   Single, no children.   [name]'s sister in law.   Social Determinants of Health   Financial Resource Strain: Not on file  Food Insecurity: Not on file  Transportation Needs: Not on file  Physical Activity: Not on file  Stress: Not on file  Social Connections: Not on file  Intimate Partner Violence: Not on file    History reviewed. No pertinent surgical history.  Family History  Problem Relation Age of Onset   Diabetes Mother    Hypertension Mother    Hypertension Father     Allergies  Allergen Reactions   Olmesartan Rash    Current Outpatient Medications on File Prior to Visit  Medication Sig Dispense Refill   blood glucose meter kit and supplies KIT Dispense based on patient and insurance preference. Use up to four times daily as directed. (FOR ICD-9 250.00, 250.01). 1 each 0   montelukast (SINGULAIR) 10 MG tablet Take 1 tablet (10 mg total) by mouth at bedtime. For allergies. 90 tablet 3   omeprazole (PRILOSEC) 40 MG capsule TAKE 1 CAPSULE BY MOUTH AS NEEDED FOR HEARTBURN 90 capsule 0   No current facility-administered medications on file prior to visit.  BP (!) 146/88   Pulse 76   Temp 98.7 F (37.1 C) (Oral)   Ht 5' 3.5" (1.613 m)   Wt 216 lb (98 kg)   SpO2 98%   BMI 37.66 kg/m  Objective:   Physical Exam Cardiovascular:     Rate and Rhythm: Normal rate and regular rhythm.  Pulmonary:     Effort: Pulmonary effort is normal.     Breath sounds: Normal breath sounds.  Musculoskeletal:     Cervical back: Neck supple.  Skin:    General: Skin is warm and dry.     Findings: Rash present.     Comments: Mildly red, bumpy rash to bilateral upper extremities           Assessment & Plan:      This visit occurred during the SARS-CoV-2 public health emergency.  Safety protocols were in place,  including screening questions prior to the visit, additional usage of staff PPE, and extensive cleaning of exam room while observing appropriate contact time as indicated for disinfecting solutions.

## 2021-09-27 NOTE — Patient Instructions (Addendum)
Stop taking olmesartan for blood pressure.  Start taking hydrochlorothiazide 12.5 mg daily for blood pressure.  Check your blood pressure at home.  Continue Zyrtec. Start famotidine (Pepcid) 20 mg daily until rash resolves. You can apply hydrocortisone cream for itching if needed.   Please schedule a follow up visit to meet back with me in 2-3 weeks for blood pressure check.   It was a pleasure to see you today!

## 2021-09-27 NOTE — Assessment & Plan Note (Signed)
Improved but still above goal. Also, now with a rash that is likely secondary to olmesartan.  Stop olmesartan.  Start HCTZ 12.5 mg daily.  Follow up in 2-3 weeks for BP check and BMP Discussed to check BP at home

## 2021-10-15 ENCOUNTER — Other Ambulatory Visit: Payer: Self-pay | Admitting: Primary Care

## 2021-10-15 DIAGNOSIS — R490 Dysphonia: Secondary | ICD-10-CM

## 2021-10-15 DIAGNOSIS — E1165 Type 2 diabetes mellitus with hyperglycemia: Secondary | ICD-10-CM

## 2021-10-18 ENCOUNTER — Ambulatory Visit (INDEPENDENT_AMBULATORY_CARE_PROVIDER_SITE_OTHER): Payer: 59 | Admitting: Primary Care

## 2021-10-18 ENCOUNTER — Encounter: Payer: Self-pay | Admitting: Primary Care

## 2021-10-18 ENCOUNTER — Ambulatory Visit: Payer: 59 | Admitting: Primary Care

## 2021-10-18 VITALS — BP 140/88 | HR 74 | Temp 98.6°F | Ht 63.5 in | Wt 215.0 lb

## 2021-10-18 DIAGNOSIS — I10 Essential (primary) hypertension: Secondary | ICD-10-CM

## 2021-10-18 DIAGNOSIS — R21 Rash and other nonspecific skin eruption: Secondary | ICD-10-CM

## 2021-10-18 HISTORY — DX: Rash and other nonspecific skin eruption: R21

## 2021-10-18 LAB — BASIC METABOLIC PANEL
BUN: 11 mg/dL (ref 6–23)
CO2: 31 mEq/L (ref 19–32)
Calcium: 9.4 mg/dL (ref 8.4–10.5)
Chloride: 99 mEq/L (ref 96–112)
Creatinine, Ser: 0.64 mg/dL (ref 0.40–1.20)
GFR: 108.21 mL/min (ref >60.00)
Glucose, Bld: 97 mg/dL (ref 70–99)
Potassium: 3.6 mEq/L (ref 3.5–5.1)
Sodium: 137 mEq/L (ref 135–145)

## 2021-10-18 MED ORDER — HYDROCHLOROTHIAZIDE 25 MG PO TABS: 25.0000 mg | ORAL_TABLET | Freq: Every day | ORAL | 1 refills | Status: DC

## 2021-10-18 NOTE — Assessment & Plan Note (Signed)
Improved but not at goal. It appears that olmesartan was not causing her rash.   Increase HCTZ to 25 mg daily. BMP pending today.  Follow up in 3 months as scheduled. Discussed to start monitoring BP at home.

## 2021-10-18 NOTE — Assessment & Plan Note (Signed)
Continued. No worse, but still present. Not caused by olmesartan.  Continue famotidine 20 mg BID. Add Zyrtec 10 mg HS. Continue Singulair 10 mg HS.  Consider prednisone course.  Consider dermatology referral if no improvement.

## 2021-10-18 NOTE — Patient Instructions (Signed)
Stop by the lab prior to leaving today. I will notify you of your results once received.   We increased your dose of hydrochlorothiazide to 25 mg.  I sent a new prescription to your pharmacy.  Start Zyrtec daily. Increase Pepcid to 20 mg twice daily.  Monitor your blood pressure at home as discussed.  It was a pleasure to see you today!

## 2021-10-18 NOTE — Progress Notes (Signed)
Subjective:    Patient ID: Tricia Davenport, female    DOB: 04-02-1976, 46 y.o.   MRN: [id number]  Hypertension Pertinent negatives include no chest pain, headaches or shortness of breath.    Tricia Davenport is a very pleasant 46 y.o. female with a history of type 2 diabetes, hypertension who presents today for follow-up of hypertension.  She was last evaluated on 09/27/2021 for follow-up of hypertension.  During this visit her blood pressure had improved somewhat since initiation of olmesartan 20 mg, but blood pressure was not at goal.  She is not checking blood pressures at home.  Her headaches had improved some.  She also made mention that she developed a rash to her bilateral upper extremities a few days after she picked up a different manufacturer of her olmesartan.  Given this information we stopped her olmesartan and started on HCTZ 12.5 mg daily.  Since her last visit she is compliant to HCTZ 12.5 mg daily. She continues to notice a rash and itching to her bilateral lower extremities that has been intermittent. She is not checking BP at home.   She has been under a lot of stress with the loss of her father and mother's recent hospitalization. Her headaches have resolved.   No new lotions, detergents, soaps or shampoos. No new medicines, vitamins, supplements. No new pets. No recent outdoor exposure or poison ivy exposure. No bonfire or smoke exposure.  No recent motel or hotel stay or new beds.   No fevers/chills, oral lesions, new joint pains, tick bites, abdominal pain, nausea.    BP Readings from Last 3 Encounters:  10/18/21 140/88  09/27/21 (!) 146/88  08/23/21 (!) 160/100      Review of Systems  Eyes:  Negative for visual disturbance.  Respiratory:  Negative for shortness of breath.   Cardiovascular:  Negative for chest pain.  Neurological:  Negative for headaches.         Past Medical History:  Diagnosis Date   Environmental and seasonal allergies      Social History   Socioeconomic History   Marital status: Married    Spouse name: Not on file   Number of children: Not on file   Years of education: Not on file   Highest education level: Not on file  Occupational History   Not on file  Tobacco Use   Smoking status: Never   Smokeless tobacco: Never  Substance and Sexual Activity   Alcohol use: Yes    Alcohol/week: 1.0 standard drink of alcohol    Types: 1 Glasses of wine per week    Comment: social   Drug use: No   Sexual activity: Yes    Birth control/protection: Condom  Other Topics Concern   Not on file  Social History Narrative   Works for [employer].   Single, no children.   [name]'s sister in law.   Social Determinants of Health   Financial Resource Strain: Not on file  Food Insecurity: Not on file  Transportation Needs: Not on file  Physical Activity: Not on file  Stress: Not on file  Social Connections: Not on file  Intimate Partner Violence: Not on file    History reviewed. No pertinent surgical history.  Family History  Problem Relation Age of Onset   Diabetes Mother    Hypertension Mother    Hypertension Father     No Active Allergies   Current Outpatient Medications on File Prior to Visit  Medication Sig Dispense  Refill   blood glucose meter kit and supplies KIT Dispense based on patient and insurance preference. Use up to four times daily as directed. (FOR ICD-9 250.00, 250.01). 1 each 0   montelukast (SINGULAIR) 10 MG tablet Take 1 tablet (10 mg total) by mouth at bedtime. For allergies. 90 tablet 3   omeprazole (PRILOSEC) 40 MG capsule TAKE 1 CAPSULE BY MOUTH EVERY DAY AS NEEDED FOR HEARTBURN 90 capsule 0   No current facility-administered medications on file prior to visit.    BP 140/88   Pulse 74   Temp 98.6 F (37 C) (Oral)   Ht 5' 3.5" (1.613 m)   Wt 215 lb (97.5 kg)   SpO2 96%   BMI 37.49 kg/m  Objective:   Physical Exam Cardiovascular:     Rate and  Rhythm: Normal rate and regular rhythm.  Pulmonary:     Effort: Pulmonary effort is normal.     Breath sounds: Normal breath sounds.  Musculoskeletal:     Cervical back: Neck supple.  Skin:    General: Skin is warm and dry.     Findings: Rash present.     Comments: Bumpy rash to bilateral posterior forearms (anatomical position) without involvement to wrists.            Assessment & Plan:   Problem List Items Addressed This Visit       Cardiovascular and Mediastinum   Essential hypertension - Primary    Improved but not at goal. It appears that olmesartan was not causing her rash.   Increase HCTZ to 25 mg daily. BMP pending today.  Follow up in 3 months as scheduled. Discussed to start monitoring BP at home.       Relevant Medications   hydrochlorothiazide (HYDRODIURIL) 25 MG tablet   Other Relevant Orders   Basic metabolic panel     Musculoskeletal and Integument   Rash and nonspecific skin eruption    Continued. No worse, but still present. Not caused by olmesartan.  Continue famotidine 20 mg BID. Add Zyrtec 10 mg HS. Continue Singulair 10 mg HS.  Consider prednisone course.  Consider dermatology referral if no improvement.           Pleas Koch, NP

## 2022-01-13 ENCOUNTER — Other Ambulatory Visit: Payer: Self-pay | Admitting: Primary Care

## 2022-01-13 DIAGNOSIS — R490 Dysphonia: Secondary | ICD-10-CM

## 2022-01-17 ENCOUNTER — Encounter: Payer: 59 | Admitting: Primary Care

## 2022-01-18 ENCOUNTER — Other Ambulatory Visit: Payer: Self-pay | Admitting: Primary Care

## 2022-01-18 DIAGNOSIS — R490 Dysphonia: Secondary | ICD-10-CM

## 2022-01-18 DIAGNOSIS — J3089 Other allergic rhinitis: Secondary | ICD-10-CM

## 2022-01-24 ENCOUNTER — Encounter: Payer: 59 | Admitting: Primary Care

## 2022-02-14 ENCOUNTER — Encounter: Payer: 59 | Admitting: Primary Care

## 2022-02-21 ENCOUNTER — Encounter: Payer: Self-pay | Admitting: Primary Care

## 2022-02-21 ENCOUNTER — Ambulatory Visit (INDEPENDENT_AMBULATORY_CARE_PROVIDER_SITE_OTHER): Payer: 59 | Admitting: Primary Care

## 2022-02-21 VITALS — BP 136/84 | HR 78 | Temp 97.3°F | Ht 63.5 in | Wt 216.0 lb

## 2022-02-21 DIAGNOSIS — I10 Essential (primary) hypertension: Secondary | ICD-10-CM | POA: Diagnosis not present

## 2022-02-21 DIAGNOSIS — Z0000 Encounter for general adult medical examination without abnormal findings: Secondary | ICD-10-CM | POA: Diagnosis not present

## 2022-02-21 DIAGNOSIS — Z1231 Encounter for screening mammogram for malignant neoplasm of breast: Secondary | ICD-10-CM

## 2022-02-21 DIAGNOSIS — R490 Dysphonia: Secondary | ICD-10-CM

## 2022-02-21 DIAGNOSIS — J3089 Other allergic rhinitis: Secondary | ICD-10-CM | POA: Diagnosis not present

## 2022-02-21 DIAGNOSIS — E1165 Type 2 diabetes mellitus with hyperglycemia: Secondary | ICD-10-CM | POA: Diagnosis not present

## 2022-02-21 LAB — COMPREHENSIVE METABOLIC PANEL
ALT: 23 U/L (ref 0–35)
AST: 20 U/L (ref 0–37)
Albumin: 4.2 g/dL (ref 3.5–5.2)
Alkaline Phosphatase: 61 U/L (ref 39–117)
BUN: 9 mg/dL (ref 6–23)
CO2: 30 mEq/L (ref 19–32)
Calcium: 9.3 mg/dL (ref 8.4–10.5)
Chloride: 100 mEq/L (ref 96–112)
Creatinine, Ser: 0.57 mg/dL (ref 0.40–1.20)
GFR: 111 mL/min (ref >60.00)
Glucose, Bld: 114 mg/dL — ABNORMAL HIGH (ref 70–99)
Potassium: 3.2 mEq/L — ABNORMAL LOW (ref 3.5–5.1)
Sodium: 138 mEq/L (ref 135–145)
Total Bilirubin: 0.5 mg/dL (ref 0.2–1.2)
Total Protein: 7.7 g/dL (ref 6.0–8.3)

## 2022-02-21 LAB — MICROALBUMIN / CREATININE URINE RATIO
Creatinine,U: 73.5 mg/dL
Microalb Creat Ratio: 1 mg/g (ref 0.0–30.0)
Microalb, Ur: <0.7 mg/dL (ref 0.0–1.9)

## 2022-02-21 LAB — CBC
HCT: 38.7 % (ref 36.0–46.0)
Hemoglobin: 13.1 g/dL (ref 12.0–15.0)
MCHC: 33.8 g/dL (ref 30.0–36.0)
MCV: 90.3 fl (ref 78.0–100.0)
Platelets: 353 10*3/uL (ref 150.0–400.0)
RBC: 4.28 Mil/uL (ref 3.87–5.11)
RDW: 13.6 % (ref 11.5–15.5)
WBC: 8.8 10*3/uL (ref 4.0–10.5)

## 2022-02-21 LAB — LIPID PANEL
Cholesterol: 159 mg/dL (ref 0–200)
HDL: 31.1 mg/dL — ABNORMAL LOW (ref >39.00)
LDL Cholesterol: 111 mg/dL — ABNORMAL HIGH (ref 0–99)
NonHDL: 127.56
Total CHOL/HDL Ratio: 5
Triglycerides: 82 mg/dL (ref 0.0–149.0)
VLDL: 16.4 mg/dL (ref 0.0–40.0)

## 2022-02-21 LAB — HEMOGLOBIN A1C: Hgb A1c MFr Bld: 6.8 % — ABNORMAL HIGH (ref 4.6–6.5)

## 2022-02-21 NOTE — Assessment & Plan Note (Addendum)
Tetanus due, she declines. Declines influenza vaccine. Pap smear due, declines today as she would like to see GYN. Mammogram due, orders placed.  Discussed the importance of a healthy diet and regular exercise in order for weight loss, and to reduce the risk of further co-morbidity.  Exam stable. Labs pending.  Follow up in 1 year for repeat physical.

## 2022-02-21 NOTE — Assessment & Plan Note (Signed)
Controlled.  Continue Singulair 10 mg HS. 

## 2022-02-21 NOTE — Assessment & Plan Note (Signed)
Not currently on treatment, remain off.  Repeat A1C pending. Urine microalbumin pending.  Not on statin therapy.  Follow up in 3-6 months based on A1C result.

## 2022-02-21 NOTE — Assessment & Plan Note (Signed)
Improved.  Continue HCTZ 25 mg daily. Olmesartan likely caused rash.  She will work on lifestyle changes.  CMP pending.

## 2022-02-21 NOTE — Assessment & Plan Note (Signed)
Controlled. Secondary to GERD.  Continue omeprazole 40 mg daily PRN.

## 2022-02-21 NOTE — Patient Instructions (Signed)
Stop by the lab prior to leaving today. I will notify you of your results once received.  ? ?[facility] to schedule your mamm[facility].  ? ?It was a pleasure to see you today! ? ?Preventive Care 40-44 Years Old, Female ?Preventive care refers to lifestyle choices and visits with your health care provider that can promote health and wellness. Preventive care visits are also called wellness exams. ?What can I expect for my preventive care visit? ?Counseling ?Your health care provider may ask you questions about your: ?Medical history, including: ?Past medical problems. ?Family medical history. ?Pregnancy history. ?Current health, including: ?Menstrual cycle. ?Method of birth control. ?Emotional well-being. ?Home life and relationship well-being. ?Sexual activity and sexual health. ?Lifestyle, including: ?Alcohol, nicotine or tobacco, and drug use. ?Access to firearms. ?Diet, exercise, and sleep habits. ?Work and work environment. ?Sunscreen use. ?Safety issues such as seatbelt and bike helmet use. ?Physical exam ?Your health care provider will check your: ?Height and weight. These may be used to calculate your BMI (body mass index). BMI is a measurement that tells if you are at a healthy weight. ?Waist circumference. This measures the distance around your waistline. This measurement also tells if you are at a healthy weight and may help predict your risk of certain diseases, such as type 2 diabetes and high blood pressure. ?Heart rate and blood pressure. ?Body temperature. ?Skin for abnormal spots. ?What immunizations do I need? ? ?Vaccines are usually given at various ages, according to a schedule. Your health care provider will recommend vaccines for you based on your age, medical history, and lifestyle or other factors, such as travel or where you work. ?What tests do I need? ?Screening ?Your health care provider may recommend screening tests for certain conditions. This may include: ?Lipid and cholesterol  levels. ?Diabetes screening. This is done by checking your blood sugar (glucose) after you have not eaten for a while (fasting). ?Pelvic exam and Pap test. ?Hepatitis B test. ?Hepatitis C test. ?HIV (human immunodeficiency virus) test. ?STI (sexually transmitted infection) testing, if you are at risk. ?Lung cancer screening. ?Colorectal cancer screening. ?Mammogram. Talk with your health care provider about when you should start having regular mammograms. This may depend on whether you have a family history of breast cancer. ?BRCA-related cancer screening. This may be done if you have a family history of breast, ovarian, tubal, or peritoneal cancers. ?Bone density scan. This is done to screen for osteoporosis. ?Talk with your health care provider about your test results, treatment options, and if necessary, the need for more tests. ?Follow these instructions at home: ?Eating and drinking ? ?Eat a diet that includes fresh fruits and vegetables, whole grains, lean protein, and low-fat dairy products. ?Take vitamin and mineral supplements as recommended by your health care provider. ?Do not drink alcohol if: ?Your health care provider tells you not to drink. ?You are pregnant, may be pregnant, or are planning to become pregnant. ?If you drink alcohol: ?Limit how much you have to 0-1 drink a day. ?Know how much alcohol is in your drink. In the [place] one drink equals one 12 oz bottle of beer (355 mL), one 5 oz glass of wine (148 mL), or one 1? oz glass of hard liquor (44 mL). ?Lifestyle ?Brush your teeth every morning and night with fluoride toothpaste. Floss one time each day. ?Exercise for at least 30 minutes 5 or more days each week. ?Do not use any products that contain nicotine or tobacco. These products include cigarettes, chewing   tobacco, and vaping devices, such as e-cigarettes. If you need help quitting, ask your health care provider. Do not use drugs. If you are sexually active, practice safe sex. Use a  condom or other form of protection to prevent STIs. If you do not wish to become pregnant, use a form of birth control. If you plan to become pregnant, see your health care provider for a prepregnancy visit. Take aspirin only as told by your health care provider. Make sure that you understand how much to take and what form to take. Work with your health care provider to find out whether it is safe and beneficial for you to take aspirin daily. Find healthy ways to manage stress, such as: Meditation, yoga, or listening to music. Journaling. Talking to a trusted person. Spending time with friends and family. Minimize exposure to UV radiation to reduce your risk of skin cancer. Safety Always wear your seat belt while driving or riding in a vehicle. Do not drive: If you have been drinking alcohol. Do not ride with someone who has been drinking. When you are tired or distracted. While texting. If you have been using any mind-altering substances or drugs. Wear a helmet and other protective equipment during sports activities. If you have firearms in your house, make sure you follow all gun safety procedures. Seek help if you have been physically or sexually abused. What's next? Visit your health care provider once a year for an annual wellness visit. Ask your health care provider how often you should have your eyes and teeth checked. Stay up to date on all vaccines. This information is not intended to replace advice given to you by your health care provider. Make sure you discuss any questions you have with your health care provider. Document Revised: 10/24/2020 Document Reviewed: 10/24/2020 Elsevier Patient Education  [place].

## 2022-02-21 NOTE — Progress Notes (Signed)
Subjective:    Patient ID: Tricia Davenport, female    DOB: 06-15-1975, 46 y.o.   MRN: [id number]  HPI  Tricia Davenport is a very pleasant 46 y.o. female who presents today for complete physical and follow up of chronic conditions.  Immunizations: -Tetanus: Due, declines  -Influenza: Declines   Diet: Fair diet.  Exercise: No regular exercise.  Eye exam: Completes annually  Dental exam: Completes semi-annually   Pap Smear: Completed in 2020, declines today, would like to see GYN  Mammogram: Completed in 2020   BP Readings from Last 3 Encounters:  02/21/22 136/84  10/18/21 140/88  09/27/21 (!) 146/88    Wt Readings from Last 3 Encounters:  02/21/22 216 lb (98 kg)  10/18/21 215 lb (97.5 kg)  09/27/21 216 lb (98 kg)      Review of Systems  Constitutional:  Negative for unexpected weight change.  HENT:  Negative for rhinorrhea.   Respiratory:  Negative for cough and shortness of breath.   Cardiovascular:  Negative for chest pain.  Gastrointestinal:  Negative for constipation and diarrhea.  Genitourinary:  Negative for difficulty urinating.  Musculoskeletal:  Negative for arthralgias and myalgias.  Skin:  Negative for rash.  Allergic/Immunologic: Negative for environmental allergies.  Neurological:  Negative for dizziness and headaches.  Psychiatric/Behavioral:  The patient is not nervous/anxious.          Past Medical History:  Diagnosis Date   Environmental and seasonal allergies    Rash and nonspecific skin eruption 10/18/2021    Social History   Socioeconomic History   Marital status: Married    Spouse name: Not on file   Number of children: Not on file   Years of education: Not on file   Highest education level: Not on file  Occupational History   Not on file  Tobacco Use   Smoking status: Never   Smokeless tobacco: Never  Substance and Sexual Activity   Alcohol use: Yes    Alcohol/week: 1.0 standard drink of alcohol    Types: 1  Glasses of wine per week    Comment: social   Drug use: No   Sexual activity: Yes    Birth control/protection: Condom  Other Topics Concern   Not on file  Social History Narrative   Works for [employer].   Single, no children.   [name]'s sister in law.   Social Determinants of Health   Financial Resource Strain: Not on file  Food Insecurity: Not on file  Transportation Needs: Not on file  Physical Activity: Not on file  Stress: Not on file  Social Connections: Not on file  Intimate Partner Violence: Not on file    History reviewed. No pertinent surgical history.  Family History  Problem Relation Age of Onset   Diabetes Mother    Hypertension Mother    Pancreatic cancer Mother 68   Hypertension Father     No Active Allergies  Current Outpatient Medications on File Prior to Visit  Medication Sig Dispense Refill   blood glucose meter kit and supplies KIT Dispense based on patient and insurance preference. Use up to four times daily as directed. (FOR ICD-9 250.00, 250.01). 1 each 0   hydrochlorothiazide (HYDRODIURIL) 25 MG tablet Take 1 tablet (25 mg total) by mouth daily. for blood pressure. 90 tablet 1   montelukast (SINGULAIR) 10 MG tablet TAKE 1 TABLET BY MOUTH AT  BEDTIME FOR ALLERGIES 90 tablet 0   omeprazole (PRILOSEC) 40 MG capsule TAKE  1 CAPSULE BY MOUTH EVERY DAY AS NEEDED FOR HEARTBURN 90 capsule 0   No current facility-administered medications on file prior to visit.    BP 136/84   Pulse 78   Temp (!) 97.3 F (36.3 C) (Temporal)   Ht 5' 3.5" (1.613 m)   Wt 216 lb (98 kg)   SpO2 99%   BMI 37.66 kg/m  Objective:   Physical Exam HENT:     Right Ear: Tympanic membrane and ear canal normal.     Left Ear: Tympanic membrane and ear canal normal.     Nose: Nose normal.  Eyes:     Conjunctiva/sclera: Conjunctivae normal.     Pupils: Pupils are equal, round, and reactive to light.  Neck:     Thyroid: No thyromegaly.  Cardiovascular:      Rate and Rhythm: Normal rate and regular rhythm.     Heart sounds: No murmur heard. Pulmonary:     Effort: Pulmonary effort is normal.     Breath sounds: Normal breath sounds. No rales.  Abdominal:     General: Bowel sounds are normal.     Palpations: Abdomen is soft.     Tenderness: There is no abdominal tenderness.  Musculoskeletal:        General: Normal range of motion.     Cervical back: Neck supple.  Lymphadenopathy:     Cervical: No cervical adenopathy.  Skin:    General: Skin is warm and dry.     Findings: No rash.  Neurological:     Mental Status: She is alert and oriented to person, place, and time.     Cranial Nerves: No cranial nerve deficit.     Deep Tendon Reflexes: Reflexes are normal and symmetric.  Psychiatric:        Mood and Affect: Mood normal.           Assessment & Plan:   Problem List Items Addressed This Visit       Cardiovascular and Mediastinum   Essential hypertension    Improved.  Continue HCTZ 25 mg daily. Olmesartan likely caused rash.  She will work on lifestyle changes.  CMP pending.      Relevant Orders   Lipid panel   Comprehensive metabolic panel   CBC     Endocrine   Type 2 diabetes mellitus with hyperglycemia (HCC)    Not currently on treatment, remain off.  Repeat A1C pending. Urine microalbumin pending.  Not on statin therapy.  Follow up in 3-6 months based on A1C result.      Relevant Orders   Microalbumin/Creatinine Ratio, Urine   Hemoglobin A1c     Other   Preventative health care - Primary    Tetanus due, she declines. Declines influenza vaccine. Pap smear due, declines today as she would like to see GYN. Mammogram due, orders placed.  Discussed the importance of a healthy diet and regular exercise in order for weight loss, and to reduce the risk of further co-morbidity.  Exam stable. Labs pending.  Follow up in 1 year for repeat physical.       Hoarseness    Controlled. Secondary to  GERD.  Continue omeprazole 40 mg daily PRN.      Environmental and seasonal allergies    Controlled.  Continue Singulair 10 mg HS.      Other Visit Diagnoses     Encounter for screening mammogram for malignant neoplasm of breast       Relevant Orders   MM 3D  SCREEN BREAST BILATERAL          Pleas Koch, NP

## 2022-02-23 ENCOUNTER — Other Ambulatory Visit: Payer: Self-pay | Admitting: Primary Care

## 2022-02-23 DIAGNOSIS — E876 Hypokalemia: Secondary | ICD-10-CM

## 2022-02-23 MED ORDER — POTASSIUM CHLORIDE CRYS ER 20 MEQ PO TBCR: 20.0000 meq | EXTENDED_RELEASE_TABLET | Freq: Two times a day (BID) | ORAL | 0 refills | Status: DC

## 2022-03-06 ENCOUNTER — Other Ambulatory Visit (INDEPENDENT_AMBULATORY_CARE_PROVIDER_SITE_OTHER): Payer: 59

## 2022-03-06 DIAGNOSIS — E876 Hypokalemia: Secondary | ICD-10-CM

## 2022-03-06 LAB — POTASSIUM: Potassium: 3.7 mEq/L (ref 3.5–5.1)

## 2022-04-07 ENCOUNTER — Other Ambulatory Visit: Payer: Self-pay | Admitting: Primary Care

## 2022-04-07 DIAGNOSIS — J3089 Other allergic rhinitis: Secondary | ICD-10-CM

## 2022-04-07 DIAGNOSIS — R490 Dysphonia: Secondary | ICD-10-CM

## 2022-04-13 ENCOUNTER — Other Ambulatory Visit: Payer: Self-pay | Admitting: Primary Care

## 2022-04-13 DIAGNOSIS — I10 Essential (primary) hypertension: Secondary | ICD-10-CM

## 2022-05-17 DIAGNOSIS — I10 Essential (primary) hypertension: Secondary | ICD-10-CM

## 2022-05-20 MED ORDER — HYDROCHLOROTHIAZIDE 25 MG PO TABS: ORAL_TABLET | ORAL | 2 refills | Status: DC

## 2023-01-20 ENCOUNTER — Other Ambulatory Visit: Payer: Self-pay | Admitting: Primary Care

## 2023-01-20 DIAGNOSIS — I10 Essential (primary) hypertension: Secondary | ICD-10-CM

## 2023-02-27 ENCOUNTER — Encounter: Payer: 59 | Admitting: Primary Care

## 2023-03-24 ENCOUNTER — Other Ambulatory Visit: Payer: Self-pay | Admitting: Primary Care

## 2023-03-24 DIAGNOSIS — R490 Dysphonia: Secondary | ICD-10-CM

## 2023-03-25 MED ORDER — OMEPRAZOLE 40 MG PO CPDR: 40.0000 mg | DELAYED_RELEASE_CAPSULE | Freq: Every day | ORAL | 0 refills | Status: DC

## 2023-04-03 ENCOUNTER — Other Ambulatory Visit (HOSPITAL_COMMUNITY)
Admission: RE | Admit: 2023-04-03 | Discharge: 2023-04-03 | Disposition: A | Discharge status: Home / Self Care | Payer: 59 | Source: Ambulatory Visit | Attending: Primary Care | Admitting: Primary Care

## 2023-04-03 ENCOUNTER — Encounter: Payer: Self-pay | Admitting: Primary Care

## 2023-04-03 ENCOUNTER — Ambulatory Visit (INDEPENDENT_AMBULATORY_CARE_PROVIDER_SITE_OTHER): Payer: 59 | Admitting: Primary Care

## 2023-04-03 VITALS — BP 152/100 | HR 104 | Temp 98.1°F | Ht 63.5 in | Wt 212.0 lb

## 2023-04-03 DIAGNOSIS — Z124 Encounter for screening for malignant neoplasm of cervix: Secondary | ICD-10-CM | POA: Insufficient documentation

## 2023-04-03 DIAGNOSIS — E1165 Type 2 diabetes mellitus with hyperglycemia: Secondary | ICD-10-CM

## 2023-04-03 DIAGNOSIS — J3089 Other allergic rhinitis: Secondary | ICD-10-CM | POA: Diagnosis not present

## 2023-04-03 DIAGNOSIS — Z1231 Encounter for screening mammogram for malignant neoplasm of breast: Secondary | ICD-10-CM

## 2023-04-03 DIAGNOSIS — Z0000 Encounter for general adult medical examination without abnormal findings: Secondary | ICD-10-CM | POA: Diagnosis not present

## 2023-04-03 DIAGNOSIS — R490 Dysphonia: Secondary | ICD-10-CM | POA: Diagnosis not present

## 2023-04-03 DIAGNOSIS — Z1211 Encounter for screening for malignant neoplasm of colon: Secondary | ICD-10-CM

## 2023-04-03 DIAGNOSIS — I10 Essential (primary) hypertension: Secondary | ICD-10-CM

## 2023-04-03 LAB — COMPREHENSIVE METABOLIC PANEL
ALT: 28 U/L (ref 0–35)
AST: 23 U/L (ref 0–37)
Albumin: 4.5 g/dL (ref 3.5–5.2)
Alkaline Phosphatase: 65 U/L (ref 39–117)
BUN: 9 mg/dL (ref 6–23)
CO2: 33 meq/L — ABNORMAL HIGH (ref 19–32)
Calcium: 9.4 mg/dL (ref 8.4–10.5)
Chloride: 99 meq/L (ref 96–112)
Creatinine, Ser: 0.62 mg/dL (ref 0.40–1.20)
GFR: 107.93 mL/min (ref >60.00)
Glucose, Bld: 134 mg/dL — ABNORMAL HIGH (ref 70–99)
Potassium: 3.5 meq/L (ref 3.5–5.1)
Sodium: 139 meq/L (ref 135–145)
Total Bilirubin: 0.5 mg/dL (ref 0.2–1.2)
Total Protein: 7.4 g/dL (ref 6.0–8.3)

## 2023-04-03 LAB — LIPID PANEL
Cholesterol: 160 mg/dL (ref 0–200)
HDL: 29 mg/dL — ABNORMAL LOW (ref >39.00)
LDL Cholesterol: 120 mg/dL — ABNORMAL HIGH (ref 0–99)
NonHDL: 131.29
Total CHOL/HDL Ratio: 6
Triglycerides: 58 mg/dL (ref 0.0–149.0)
VLDL: 11.6 mg/dL (ref 0.0–40.0)

## 2023-04-03 LAB — MICROALBUMIN / CREATININE URINE RATIO
Creatinine,U: 54.1 mg/dL
Microalb Creat Ratio: 1.3 mg/g (ref 0.0–30.0)
Microalb, Ur: <0.7 mg/dL (ref 0.0–1.9)

## 2023-04-03 LAB — HEMOGLOBIN A1C: Hgb A1c MFr Bld: 6.6 % — ABNORMAL HIGH (ref 4.6–6.5)

## 2023-04-03 NOTE — Assessment & Plan Note (Signed)
Repeat A1C pending. Commended her on weight loss!  Foot exam today. Eye exam UTD.  Remain off treatment for now.  Follow up in 6 months.  Wt Readings from Last 3 Encounters:  04/03/23 212 lb (96.2 kg)  02/21/22 216 lb (98 kg)  10/18/21 215 lb (97.5 kg)

## 2023-04-03 NOTE — Assessment & Plan Note (Signed)
Above goal today, also on recheck. I have asked that she start monitoring readings at home and send readings via MyChart in 1 week.  Continue hydrochlorothiazide 25 mg daily for now. Consider addition of amlodipine 5 mg daily.  CMP pending.

## 2023-04-03 NOTE — Progress Notes (Addendum)
Subjective:    Patient ID: Tricia Davenport, female    DOB: 09/25/1975, 47 y.o.   MRN: [id number]  HPI  Tricia Davenport is a very pleasant 47 y.o. female who presents today for complete physical and follow up of chronic conditions.  Immunizations: -Tetanus: Due, declines -Influenza: Declines today -Pneumonia: Declines today   Diet: Fair diet.  Exercise: No regular exercise.  Eye exam: Completes annually  Dental exam: Completes semi-annually    Pap Smear: Completed in 2020, declined last year. Mammogram: Completed in 2020  Colonoscopy: Never completed, due in 1 month.   BP Readings from Last 3 Encounters:  04/03/23 (!) 154/94  02/21/22 136/84  10/18/21 140/88     Review of Systems  Constitutional:  Negative for unexpected weight change.  HENT:  Negative for rhinorrhea.   Respiratory:  Negative for cough and shortness of breath.   Cardiovascular:  Negative for chest pain.  Gastrointestinal:  Negative for constipation and diarrhea.  Genitourinary:  Negative for difficulty urinating.  Musculoskeletal:  Negative for arthralgias and myalgias.  Skin:  Negative for rash.  Allergic/Immunologic: Negative for environmental allergies.  Neurological:  Negative for dizziness, numbness and headaches.  Psychiatric/Behavioral:  The patient is not nervous/anxious.          Past Medical History:  Diagnosis Date   Environmental and seasonal allergies    Rash and nonspecific skin eruption 10/18/2021    Social History   Socioeconomic History   Marital status: Married    Spouse name: Not on file   Number of children: Not on file   Years of education: Not on file   Highest education level: Some college, no degree  Occupational History   Not on file  Tobacco Use   Smoking status: Never   Smokeless tobacco: Never  Substance and Sexual Activity   Alcohol use: Yes    Alcohol/week: 1.0 standard drink of alcohol    Types: 1 Glasses of wine per week    Comment: social    Drug use: No   Sexual activity: Yes    Birth control/protection: Condom  Other Topics Concern   Not on file  Social History Narrative   Works for [employer].   Single, no children.   [name]'s sister in law.   Social Determinants of Health   Financial Resource Strain: Low Risk  (04/01/2023)   Overall Financial Resource Strain (CARDIA)    Difficulty of Paying Living Expenses: Not hard at all  Food Insecurity: No Food Insecurity (04/01/2023)   Hunger Vital Sign    Worried About Running Out of Food in the Last Year: Never true    Ran Out of Food in the Last Year: Never true  Transportation Needs: No Transportation Needs (04/01/2023)   PRAPARE - Administrator, Civil Service (Medical): No    Lack of Transportation (Non-Medical): No  Physical Activity: Insufficiently Active (04/01/2023)   Exercise Vital Sign    Days of Exercise per Week: 4 days    Minutes of Exercise per Session: 30 min  Stress: No Stress Concern Present (04/01/2023)   [employer] of Occupational Health - Occupational Stress Questionnaire    Feeling of Stress : Not at all  Social Connections: Moderately Integrated (04/01/2023)   Social Connection and Isolation Panel [NHANES]    Frequency of Communication with Friends and Family: More than three times a week    Frequency of Social Gatherings with Friends and Family: Twice a week    Attends  Religious Services: More than 4 times per year    Active Member of Clubs or Organizations: No    Attends Banker Meetings: Not on file    Marital Status: Married  Catering manager Violence: Not on file    History reviewed. No pertinent surgical history.  Family History  Problem Relation Age of Onset   Diabetes Mother    Hypertension Mother    Pancreatic cancer Mother 74   Hypertension Father     No Active Allergies  Current Outpatient Medications on File Prior to Visit  Medication Sig Dispense Refill   blood glucose  meter kit and supplies KIT Dispense based on patient and insurance preference. Use up to four times daily as directed. (FOR ICD-9 250.00, 250.01). 1 each 0   hydrochlorothiazide (HYDRODIURIL) 25 MG tablet TAKE 1 TABLET BY MOUTH DAILY FOR BLOOD PRESSURE 90 tablet 0   montelukast (SINGULAIR) 10 MG tablet TAKE 1 TABLET BY MOUTH AT  BEDTIME FOR ALLERGIES 90 tablet 2   omeprazole (PRILOSEC) 40 MG capsule Take 1 capsule (40 mg total) by mouth daily. 90 capsule 0   No current facility-administered medications on file prior to visit.    BP (!) 154/94 (BP Location: Left Arm, Patient Position: Sitting, Cuff Size: Normal)   Pulse (!) 104   Temp 98.1 F (36.7 C) (Oral)   Ht 5' 3.5" (1.613 m)   Wt 212 lb (96.2 kg)   SpO2 98%   BMI 36.97 kg/m  Objective:   Physical Exam Exam conducted with a chaperone present.  HENT:     Right Ear: Tympanic membrane and ear canal normal.     Left Ear: Tympanic membrane and ear canal normal.  Eyes:     Pupils: Pupils are equal, round, and reactive to light.  Cardiovascular:     Rate and Rhythm: Normal rate and regular rhythm.  Pulmonary:     Effort: Pulmonary effort is normal.     Breath sounds: Normal breath sounds.  Abdominal:     General: Bowel sounds are normal.     Palpations: Abdomen is soft.     Tenderness: There is no abdominal tenderness.  Genitourinary:    Labia:        Right: No rash, tenderness or lesion.        Left: No rash, tenderness or lesion.      Vagina: Normal.     Cervix: Normal.     Uterus: Normal.      Adnexa: Right adnexa normal and left adnexa normal.  Musculoskeletal:        General: Normal range of motion.     Cervical back: Neck supple.  Skin:    General: Skin is warm and dry.  Neurological:     Mental Status: She is alert and oriented to person, place, and time.     Cranial Nerves: No cranial nerve deficit.     Deep Tendon Reflexes:     Reflex Scores:      Patellar reflexes are 2+ on the right side and 2+ on the left  side. Psychiatric:        Mood and Affect: Mood normal.           Assessment & Plan:  Preventative health care Assessment & Plan: Declines influenza, tetanus, pneumonia vaccines today. Pap smear due, completed today Mammogram due, orders placed. Colonoscopy due at age 12 which is in 1 month, referral placed to GI  Discussed the importance of a healthy diet and regular  exercise in order for weight loss, and to reduce the risk of further co-morbidity.  Exam stable. Labs pending.  Follow up in 1 year for repeat physical.    Type 2 diabetes mellitus with hyperglycemia, without long-term current use of insulin (HCC) Assessment & Plan: Repeat A1C pending. Commended her on weight loss!  Foot exam today. Eye exam UTD.  Remain off treatment for now.  Follow up in 6 months.  Wt Readings from Last 3 Encounters:  04/03/23 212 lb (96.2 kg)  02/21/22 216 lb (98 kg)  10/18/21 215 lb (97.5 kg)     Orders: -     Lipid panel -     Hemoglobin A1c -     Comprehensive metabolic panel -     Microalbumin / creatinine urine ratio  Environmental and seasonal allergies Assessment & Plan: Controlled.  Continue Singulair 10 mg HS PRN.   Hoarseness Assessment & Plan: Controlled.  Continue omeprazole 40 mg. Discussed that she could reduce omeprazole 20 mg PRN or really down to famotidine 20 mg PRN.   Screening mammogram for breast cancer -     3D Screening Mammogram, Left and Right; Future  Screening for colon cancer -     Ambulatory referral to Gastroenterology  Screening for cervical cancer -     Cytology - PAP  Essential hypertension Assessment & Plan: Above goal today, also on recheck. I have asked that she start monitoring readings at home and send readings via MyChart in 1 week.  Continue hydrochlorothiazide 25 mg daily for now. Consider addition of amlodipine 5 mg daily.  CMP pending.         Doreene Nest, NP

## 2023-04-03 NOTE — Assessment & Plan Note (Signed)
Controlled.  Continue Singulair 10 mg HS PRN.

## 2023-04-03 NOTE — Assessment & Plan Note (Signed)
Declines influenza, tetanus, pneumonia vaccines today. Pap smear due, completed today Mammogram due, orders placed. Colonoscopy due at age 47 which is in 1 month, referral placed to GI  Discussed the importance of a healthy diet and regular exercise in order for weight loss, and to reduce the risk of further co-morbidity.  Exam stable. Labs pending.  Follow up in 1 year for repeat physical.

## 2023-04-03 NOTE — Assessment & Plan Note (Signed)
Controlled.  Continue omeprazole 40 mg. Discussed that she could reduce omeprazole 20 mg PRN or really down to famotidine 20 mg PRN.

## 2023-04-03 NOTE — Patient Instructions (Addendum)
Call the mobile mammogram unit to schedule your appointment.  You will either be contacted via phone regarding your referral to GI, or you may receive a letter on your MyChart portal from our referral team with instructions for scheduling an appointment. Please let us know if you have not been contacted by anyone within two weeks.  Stop by the lab prior to leaving today. I will notify you of your results once received.   Try using famotidine (Pepcid) as needed for hoarseness/reflux.  You can buy this over-the-counter.  Send me your blood pressure readings in 1 week via MyChart.  Your blood pressure should run consistently below 140 on top and consistently below 90 on bottom.  Please schedule a follow up visit for 6 months for a diabetes check.  It was a pleasure to see you today!

## 2023-04-07 LAB — CYTOLOGY - PAP
Comment: NEGATIVE
Diagnosis: NEGATIVE
High risk HPV: NEGATIVE

## 2023-04-17 ENCOUNTER — Encounter: Payer: Self-pay | Admitting: *Deleted

## 2023-04-17 ENCOUNTER — Encounter: Payer: Self-pay | Admitting: Primary Care

## 2023-04-17 NOTE — Telephone Encounter (Signed)
 Care team updated and letter sent for eye exam notes.

## 2023-04-19 ENCOUNTER — Other Ambulatory Visit: Payer: Self-pay | Admitting: Primary Care

## 2023-04-19 DIAGNOSIS — I10 Essential (primary) hypertension: Secondary | ICD-10-CM

## 2023-07-20 ENCOUNTER — Other Ambulatory Visit: Payer: Self-pay

## 2023-07-20 DIAGNOSIS — R490 Dysphonia: Secondary | ICD-10-CM

## 2023-07-20 NOTE — Telephone Encounter (Signed)
 Please call patient:  Received refill request for omeprazole 40 mg for her hoarsness. This is a high dose and should not be taken for long periods of time. Would she be willing to reduce down to 20 mg?

## 2023-07-20 NOTE — Telephone Encounter (Signed)
 Unable to reach patient. Left voicemail to return call to our office.

## 2023-07-20 NOTE — Telephone Encounter (Signed)
 Patient called back acknowledged understanding regarding medication. States would like the refill on the 20mg 

## 2023-07-21 MED ORDER — OMEPRAZOLE 20 MG PO CPDR: 20.0000 mg | DELAYED_RELEASE_CAPSULE | Freq: Every day | ORAL | 0 refills | Status: AC

## 2023-07-21 NOTE — Telephone Encounter (Signed)
Noted, new Rx sent to pharmacy. 

## 2024-02-23 ENCOUNTER — Other Ambulatory Visit: Payer: Self-pay | Admitting: Primary Care

## 2024-02-23 DIAGNOSIS — I10 Essential (primary) hypertension: Secondary | ICD-10-CM

## 2024-04-01 ENCOUNTER — Encounter: Payer: 59 | Admitting: Primary Care

## 2024-04-12 ENCOUNTER — Ambulatory Visit: Payer: Self-pay | Admitting: Primary Care

## 2024-04-12 ENCOUNTER — Ambulatory Visit: Admitting: Primary Care

## 2024-04-12 ENCOUNTER — Encounter: Payer: Self-pay | Admitting: Primary Care

## 2024-04-12 VITALS — BP 174/100 | HR 102 | Temp 98.9°F | Ht 64.25 in | Wt 190.5 lb

## 2024-04-12 DIAGNOSIS — Z1231 Encounter for screening mammogram for malignant neoplasm of breast: Secondary | ICD-10-CM

## 2024-04-12 DIAGNOSIS — R490 Dysphonia: Secondary | ICD-10-CM

## 2024-04-12 DIAGNOSIS — Z7985 Long-term (current) use of injectable non-insulin antidiabetic drugs: Secondary | ICD-10-CM

## 2024-04-12 DIAGNOSIS — I10 Essential (primary) hypertension: Secondary | ICD-10-CM

## 2024-04-12 DIAGNOSIS — E1165 Type 2 diabetes mellitus with hyperglycemia: Secondary | ICD-10-CM

## 2024-04-12 DIAGNOSIS — Z1211 Encounter for screening for malignant neoplasm of colon: Secondary | ICD-10-CM

## 2024-04-12 DIAGNOSIS — Z0000 Encounter for general adult medical examination without abnormal findings: Secondary | ICD-10-CM

## 2024-04-12 DIAGNOSIS — Z0001 Encounter for general adult medical examination with abnormal findings: Secondary | ICD-10-CM

## 2024-04-12 LAB — COMPREHENSIVE METABOLIC PANEL WITH GFR
ALT: 25 U/L (ref 0–35)
AST: 22 U/L (ref 0–37)
Albumin: 4.3 g/dL (ref 3.5–5.2)
Alkaline Phosphatase: 54 U/L (ref 39–117)
BUN: 9 mg/dL (ref 6–23)
CO2: 33 meq/L — ABNORMAL HIGH (ref 19–32)
Calcium: 9.2 mg/dL (ref 8.4–10.5)
Chloride: 100 meq/L (ref 96–112)
Creatinine, Ser: 0.59 mg/dL (ref 0.40–1.20)
GFR: 108.44 mL/min (ref >60.00)
Glucose, Bld: 112 mg/dL — ABNORMAL HIGH (ref 70–99)
Potassium: 3.6 meq/L (ref 3.5–5.1)
Sodium: 139 meq/L (ref 135–145)
Total Bilirubin: 0.6 mg/dL (ref 0.2–1.2)
Total Protein: 7.1 g/dL (ref 6.0–8.3)

## 2024-04-12 LAB — MICROALBUMIN / CREATININE URINE RATIO
Creatinine,U: 90.4 mg/dL
Microalb Creat Ratio: UNDETERMINED mg/g (ref 0.0–30.0)
Microalb, Ur: <0.7 mg/dL

## 2024-04-12 LAB — CBC
HCT: 38.4 % (ref 36.0–46.0)
Hemoglobin: 13 g/dL (ref 12.0–15.0)
MCHC: 33.9 g/dL (ref 30.0–36.0)
MCV: 90.5 fl (ref 78.0–100.0)
Platelets: 405 K/uL — ABNORMAL HIGH (ref 150.0–400.0)
RBC: 4.24 Mil/uL (ref 3.87–5.11)
RDW: 13.7 % (ref 11.5–15.5)
WBC: 7.9 K/uL (ref 4.0–10.5)

## 2024-04-12 LAB — LIPID PANEL
Cholesterol: 142 mg/dL (ref 0–200)
HDL: 33.1 mg/dL — ABNORMAL LOW (ref >39.00)
LDL Cholesterol: 99 mg/dL (ref 0–99)
NonHDL: 109.31
Total CHOL/HDL Ratio: 4
Triglycerides: 52 mg/dL (ref 0.0–149.0)
VLDL: 10.4 mg/dL (ref 0.0–40.0)

## 2024-04-12 LAB — VITAMIN D 25 HYDROXY (VIT D DEFICIENCY, FRACTURES): VITD: 20.77 ng/mL — ABNORMAL LOW (ref 30.00–100.00)

## 2024-04-12 LAB — TSH: TSH: 1.13 u[IU]/mL (ref 0.35–5.50)

## 2024-04-12 LAB — HEMOGLOBIN A1C: Hgb A1c MFr Bld: 5.7 % (ref 4.6–6.5)

## 2024-04-12 MED ORDER — AMLODIPINE BESYLATE 5 MG PO TABS: 5.0000 mg | ORAL_TABLET | Freq: Every day | ORAL | 0 refills | Status: DC

## 2024-04-12 MED ORDER — OZEMPIC (0.25 OR 0.5 MG/DOSE) 2 MG/3ML ~~LOC~~ SOPN: PEN_INJECTOR | SUBCUTANEOUS | 0 refills | Status: DC

## 2024-04-12 NOTE — Progress Notes (Signed)
 Subjective:    Patient ID: Tricia Davenport, female    DOB: January 26, 1976, 48 y.o.   MRN: [id number]  Tricia Davenport is a very pleasant 48 y.o. female who presents today for complete physical and follow up of chronic conditions.  Immunizations: -Tetanus: Due, declined in 2024 -Influenza: Declines influenza vaccine.  Diet: Fair diet.  Exercise: Regular exercise.  Eye exam: Completes annually  Dental exam: Completes semi-annually    Pap Smear: Completed in 2024 Mammogram: Completed in 2020  Colonoscopy: Never completed   BP Readings from Last 3 Encounters:  04/12/24 (!) 174/100  04/03/23 (!) 152/100  02/21/22 136/84   She is checking her BP at home which is running 130-140/90-100. She began taking compounded semaglutide in July 2025. Her last dose was 2 weeks ago. She's up to a high dose. She is open to a prescription for Ozempic for which she declined last visit.   She began a vitamin D and multivitamin supplement  months ago.   Wt Readings from Last 3 Encounters:  04/12/24 190 lb 8 oz (86.4 kg)  04/03/23 212 lb (96.2 kg)  02/21/22 216 lb (98 kg)     Review of Systems  Constitutional:  Negative for unexpected weight change.  HENT:  Negative for rhinorrhea.   Respiratory:  Negative for cough and shortness of breath.   Cardiovascular:  Negative for chest pain.  Gastrointestinal:  Negative for constipation and diarrhea.  Genitourinary:  Negative for difficulty urinating.  Musculoskeletal:  Negative for arthralgias and myalgias.  Skin:  Negative for rash.  Allergic/Immunologic: Negative for environmental allergies.  Neurological:  Negative for dizziness, numbness and headaches.  Psychiatric/Behavioral:  The patient is not nervous/anxious.          Past Medical History:  Diagnosis Date   Allergy    Environmental and seasonal allergies    GERD (gastroesophageal reflux disease)    Rash and nonspecific skin eruption 10/18/2021    Social History    Socioeconomic History   Marital status: Married    Spouse name: Not on file   Number of children: Not on file   Years of education: Not on file   Highest education level: Some college, no degree  Occupational History   Not on file  Tobacco Use   Smoking status: Never   Smokeless tobacco: Never  Substance and Sexual Activity   Alcohol use: Yes    Alcohol/week: 1.0 standard drink of alcohol    Types: 1 Glasses of wine per week    Comment: Social drinker   Drug use: Never   Sexual activity: Yes    Birth control/protection: Condom  Other Topics Concern   Not on file  Social History Narrative   Works for [employer].   Single, no children.   Tricia Davenport's sister in law.   Social Drivers of Corporate Investment Banker Strain: Low Risk  (04/11/2024)   Overall Financial Resource Strain (CARDIA)    Difficulty of Paying Living Expenses: Not very hard  Food Insecurity: No Food Insecurity (04/11/2024)   Hunger Vital Sign    Worried About Running Out of Food in the Last Year: Never true    Ran Out of Food in the Last Year: Never true  Transportation Needs: No Transportation Needs (04/11/2024)   PRAPARE - Administrator, Civil Service (Medical): No    Lack of Transportation (Non-Medical): No  Physical Activity: Sufficiently Active (04/11/2024)   Exercise Vital Sign    Days of Exercise  per Week: 5 days    Minutes of Exercise per Session: 50 min  Stress: No Stress Concern Present (04/11/2024)   [employer] of Occupational Health - Occupational Stress Questionnaire    Feeling of Stress: Only a little  Social Connections: Unknown (04/11/2024)   Social Connection and Isolation Panel    Frequency of Communication with Friends and Family: More than three times a week    Frequency of Social Gatherings with Friends and Family: Twice a week    Attends Religious Services: More than 4 times per year    Active Member of [employer] or Organizations: Patient declined     Attends Banker Meetings: Not on file    Marital Status: Married  Catering Manager Violence: Not on file    History reviewed. No pertinent surgical history.  Family History  Problem Relation Age of Onset   Diabetes Mother    Hypertension Mother    Pancreatic cancer Mother 43   Cancer Mother    Hypertension Father     Allergies  Allergen Reactions   Olmesartan  Rash    Current Outpatient Medications on File Prior to Visit  Medication Sig Dispense Refill   blood glucose meter kit and supplies KIT Dispense based on patient and insurance preference. Use up to four times daily as directed. (FOR ICD-9 250.00, 250.01). 1 each 0   hydrochlorothiazide  (HYDRODIURIL ) 25 MG tablet TAKE 1 TABLET BY MOUTH DAILY FOR BLOOD PRESSURE 90 tablet 0   omeprazole  (PRILOSEC) 20 MG capsule Take 1 capsule (20 mg total) by mouth daily. For reflux 90 capsule 0   No current facility-administered medications on file prior to visit.    BP (!) 174/100   Pulse (!) 102   Temp 98.9 F (37.2 C) (Oral)   Ht 5' 4.25 (1.632 m)   Wt 190 lb 8 oz (86.4 kg)   LMP 04/06/2024   SpO2 98%   BMI 32.45 kg/m  Objective:   Physical Exam HENT:     Right Ear: Tympanic membrane and ear canal normal.     Left Ear: Tympanic membrane and ear canal normal.  Eyes:     Pupils: Pupils are equal, round, and reactive to light.  Cardiovascular:     Rate and Rhythm: Normal rate and regular rhythm.  Pulmonary:     Effort: Pulmonary effort is normal.     Breath sounds: Normal breath sounds.  Abdominal:     General: Bowel sounds are normal.     Palpations: Abdomen is soft.     Tenderness: There is no abdominal tenderness.  Musculoskeletal:        General: Normal range of motion.     Cervical back: Neck supple.  Skin:    General: Skin is warm and dry.  Neurological:     Mental Status: She is alert and oriented to person, place, and time.     Cranial Nerves: No cranial nerve deficit.     Deep Tendon Reflexes:      Reflex Scores:      Patellar reflexes are 2+ on the right side and 2+ on the left side. Psychiatric:        Mood and Affect: Mood normal.     Physical Exam        Assessment & Plan:  Encounter for annual general medical examination with abnormal findings in adult Assessment & Plan: Declines all vaccines.  Pap smear UTD. Mammogram due, orders placed. Colonoscopy due, referral placed to GI  Discussed the importance  of a healthy diet and regular exercise in order for weight loss, and to reduce the risk of further co-morbidity.  Exam stable. Labs pending.  Follow up in 1 year for repeat physical.    Type 2 diabetes mellitus with hyperglycemia, without long-term current use of insulin (HCC) Assessment & Plan: Repeat A1C pending.  Agreed to prescribe Ozempic  given her weight loss and diabetes history. Stop compounded Ozempic .  Start semaglutide  (Ozempic ) for diabetes. Start by injecting 0.25 mg into the skin once weekly for 4 weeks, then increase to 0.5 mg once weekly thereafter.  Urine microalbumin due and pending. Foot exam today.  Follow up in 6 months.   Orders: -     Lipid panel -     VITAMIN D  25 Hydroxy (Vit-D Deficiency, Fractures) -     Comprehensive metabolic panel with GFR -     CBC -     Hemoglobin A1c -     Microalbumin / creatinine urine ratio -     Ozempic  (0.25 or 0.5 MG/DOSE); Inject 0.25 mg into the skin once weekly for 4 weeks, then increase to 0.5 mg once weekly thereafter for diabetes.  Dispense: 2 mL; Refill: 0 -     TSH  Hoarseness Assessment & Plan: Controlled.  Continue omeprazole  20 mg PRN   Screening mammogram for breast cancer -     3D Screening Mammogram, Left and Right; Future  Screening for colon cancer -     Ambulatory referral to Gastroenterology  Essential hypertension Assessment & Plan: Above today, also with her readings and upon recheck.  Recommend to add amlodipine  5 mg daily and continue HCTZ 25 mg daily. She  would like to discontinue hydrochlorothiazide  for a goal of pill count reduction.  We discussed that blood pressure may or may not improve on amlodipine  5 mg daily alone.  She will start with amlodipine  5 mg daily and monitor her blood pressure.  If blood pressure readings increase and or do not improve then we will resume HCTZ 25 mg daily.  Unfortunately, she had an allergic reaction to Olmesartan .   We will see her back in 2-3 weeks for BP check.  Orders: -     amLODIPine  Besylate; Take 1 tablet (5 mg total) by mouth daily. for blood pressure.  Dispense: 30 tablet; Refill: 0    Assessment and Plan Assessment & Plan       Comer MARLA Gaskins, NP   History of Present Illness

## 2024-04-12 NOTE — Assessment & Plan Note (Signed)
 Declines all vaccines.  Pap smear UTD. Mammogram due, orders placed. Colonoscopy due, referral placed to GI  Discussed the importance of a healthy diet and regular exercise in order for weight loss, and to reduce the risk of further co-morbidity.  Exam stable. Labs pending.  Follow up in 1 year for repeat physical.

## 2024-04-12 NOTE — Assessment & Plan Note (Signed)
 Repeat A1C pending.  Agreed to prescribe Ozempic given her weight loss and diabetes history. Stop compounded Ozempic.  Start semaglutide (Ozempic) for diabetes. Start by injecting 0.25 mg into the skin once weekly for 4 weeks, then increase to 0.5 mg once weekly thereafter.  Urine microalbumin due and pending. Foot exam today.  Follow up in 6 months.

## 2024-04-12 NOTE — Assessment & Plan Note (Signed)
 Above today, also with her readings and upon recheck.  Recommend to add amlodipine 5 mg daily and continue HCTZ 25 mg daily. She would like to discontinue hydrochlorothiazide  for a goal of pill count reduction.  We discussed that blood pressure may or may not improve on amlodipine 5 mg daily alone.  She will start with amlodipine 5 mg daily and monitor her blood pressure.  If blood pressure readings increase and or do not improve then we will resume HCTZ 25 mg daily.  Unfortunately, she had an allergic reaction to Olmesartan .   We will see her back in 2-3 weeks for BP check.

## 2024-04-12 NOTE — Assessment & Plan Note (Addendum)
Controlled.  Continue omeprazole 20 mg PRN.

## 2024-04-12 NOTE — Patient Instructions (Addendum)
 Stop by the lab prior to leaving today. I will notify you of your results once received.   [facility].   You will receive a phone call regarding the colonoscopy.  Start semaglutide  (Ozempic ) for diabetes. Start by injecting 0.25 mg into the skin once weekly for 4 weeks, then increase to 0.5 mg once weekly thereafter for diabetes.   Start taking amlodipine  5 mg once daily for BP. Stop taking hydrochlorothiazide  for now.   Continue to monitor your blood pressure.   Please schedule a follow up visit to meet back with me in 2-3 weeks for blood pressure check.   It was a pleasure to see you today!

## 2024-04-13 MED ORDER — AMLODIPINE BESYLATE 5 MG PO TABS: 5.0000 mg | ORAL_TABLET | Freq: Every day | ORAL | 0 refills | Status: DC

## 2024-04-13 NOTE — Addendum Note (Signed)
 Addended by: Coady Train K on: 04/13/2024 12:52 PM   Modules accepted: Orders

## 2024-04-13 NOTE — Telephone Encounter (Signed)
 Noted.  Will change amlodipine to a 90-day supply.

## 2024-04-13 NOTE — Telephone Encounter (Signed)
 Spoke with [facility] [address] asking about rxs. Told pt's insurance requests a 90- day rx for amlodipine. As far as Ozempic, they only keep so much in stock at a time and they had already been through the supply when pt's request came through. However, they have placed an order and pt's rx should be available tomorrow.

## 2024-04-22 ENCOUNTER — Telehealth: Payer: Self-pay | Admitting: Primary Care

## 2024-04-22 NOTE — Telephone Encounter (Signed)
 Faxed form.  [Mailed original to pt. Made copy to scan.]

## 2024-04-22 NOTE — Telephone Encounter (Signed)
 Placed form in Kate's box.

## 2024-04-22 NOTE — Telephone Encounter (Signed)
 Type of forms received:physican form   Routed to: [employer] received by : Randall     Individual made aware of 3-5 business day turn around (Y/N): Y   Form completed and patient made aware of charges(Y/N): Y      Form location:  Provider's folder upfront

## 2024-04-22 NOTE — Telephone Encounter (Signed)
 Form completed and placed in the inbox.

## 2024-05-17 DIAGNOSIS — I10 Essential (primary) hypertension: Secondary | ICD-10-CM

## 2024-05-18 DIAGNOSIS — E1165 Type 2 diabetes mellitus with hyperglycemia: Secondary | ICD-10-CM

## 2024-05-19 ENCOUNTER — Other Ambulatory Visit (HOSPITAL_COMMUNITY): Payer: Self-pay

## 2024-05-19 ENCOUNTER — Telehealth: Payer: Self-pay

## 2024-05-19 NOTE — Telephone Encounter (Signed)
 Pharmacy Patient Advocate Encounter   Received notification from Physician's Office that prior authorization for Ozempic  2 mg/3 ml pen is required/requested.   Insurance verification completed.   The patient is insured through [facility].   Per test claim: The current 28 day co-pay is, $150.  No PA needed at this time. This test claim was processed through [facility]- copay amounts may vary at other pharmacies due to pharmacy/plan contracts, or as the patient moves through the different stages of their insurance plan.

## 2024-05-19 NOTE — Telephone Encounter (Signed)
 Pls submit PA for Ozempic  0.25 mg.

## 2024-05-25 ENCOUNTER — Other Ambulatory Visit: Payer: Self-pay | Admitting: *Deleted

## 2024-05-25 DIAGNOSIS — E1165 Type 2 diabetes mellitus with hyperglycemia: Secondary | ICD-10-CM

## 2024-05-25 MED ORDER — OZEMPIC (0.25 OR 0.5 MG/DOSE) 2 MG/3ML ~~LOC~~ SOPN: 0.5000 mg | PEN_INJECTOR | SUBCUTANEOUS | 0 refills | Status: AC

## 2024-06-03 ENCOUNTER — Other Ambulatory Visit (HOSPITAL_COMMUNITY): Payer: Self-pay

## 2024-06-09 ENCOUNTER — Ambulatory Visit: Admitting: Primary Care

## 2024-06-13 ENCOUNTER — Other Ambulatory Visit: Payer: Self-pay | Admitting: Primary Care

## 2024-06-13 DIAGNOSIS — I10 Essential (primary) hypertension: Secondary | ICD-10-CM

## 2024-06-15 ENCOUNTER — Ambulatory Visit: Admitting: Primary Care

## 2024-06-24 ENCOUNTER — Ambulatory Visit: Admitting: Primary Care

## 2025-04-14 ENCOUNTER — Encounter: Admitting: Primary Care
